# Patient Record
Sex: Female | Born: 1960
Health system: Southern US, Community
[De-identification: ages and names within clinical notes are randomized; demographics above are authoritative.]

## PROBLEM LIST (undated history)

## (undated) DIAGNOSIS — C4490 Unspecified malignant neoplasm of skin, unspecified: Secondary | ICD-10-CM

## (undated) DIAGNOSIS — L719 Rosacea, unspecified: Secondary | ICD-10-CM

## (undated) DIAGNOSIS — L821 Other seborrheic keratosis: Secondary | ICD-10-CM

## (undated) DIAGNOSIS — I2510 Atherosclerotic heart disease of native coronary artery without angina pectoris: Secondary | ICD-10-CM

## (undated) DIAGNOSIS — E785 Hyperlipidemia, unspecified: Secondary | ICD-10-CM

## (undated) HISTORY — PX: OTHER SURGICAL HISTORY: SHX169

## (undated) HISTORY — PX: BREAST REDUCTION SURGERY: SHX8

## (undated) HISTORY — DX: Hyperlipidemia, unspecified: E78.5

## (undated) HISTORY — DX: Atherosclerotic heart disease of native coronary artery without angina pectoris: I25.10

## (undated) HISTORY — PX: PLANTAR FASCIECTOMY: SUR600

---

## 1998-05-23 ENCOUNTER — Other Ambulatory Visit: Admission: RE | Admit: 1998-05-23 | Discharge: 1998-05-23 | Payer: Self-pay | Admitting: Obstetrics and Gynecology

## 1998-11-10 ENCOUNTER — Ambulatory Visit (HOSPITAL_BASED_OUTPATIENT_CLINIC_OR_DEPARTMENT_OTHER): Admission: RE | Admit: 1998-11-10 | Discharge: 1998-11-10 | Payer: Self-pay | Admitting: Orthopedic Surgery

## 1999-06-12 ENCOUNTER — Other Ambulatory Visit: Admission: RE | Admit: 1999-06-12 | Discharge: 1999-06-12 | Payer: Self-pay | Admitting: *Deleted

## 2000-06-09 ENCOUNTER — Other Ambulatory Visit: Admission: RE | Admit: 2000-06-09 | Discharge: 2000-06-09 | Payer: Self-pay | Admitting: *Deleted

## 2000-12-27 HISTORY — PX: BACK SURGERY: SHX140

## 2001-06-13 ENCOUNTER — Other Ambulatory Visit: Admission: RE | Admit: 2001-06-13 | Discharge: 2001-06-13 | Payer: Self-pay | Admitting: Obstetrics and Gynecology

## 2001-08-21 ENCOUNTER — Encounter: Payer: Self-pay | Admitting: Orthopedic Surgery

## 2001-08-22 ENCOUNTER — Encounter: Payer: Self-pay | Admitting: Orthopedic Surgery

## 2001-08-22 ENCOUNTER — Encounter (INDEPENDENT_AMBULATORY_CARE_PROVIDER_SITE_OTHER): Payer: Self-pay | Admitting: *Deleted

## 2001-08-22 ENCOUNTER — Ambulatory Visit (HOSPITAL_COMMUNITY): Admission: RE | Admit: 2001-08-22 | Discharge: 2001-08-23 | Payer: Self-pay | Admitting: Orthopedic Surgery

## 2002-06-26 ENCOUNTER — Other Ambulatory Visit: Admission: RE | Admit: 2002-06-26 | Discharge: 2002-06-26 | Payer: Self-pay | Admitting: Obstetrics and Gynecology

## 2003-07-09 ENCOUNTER — Other Ambulatory Visit: Admission: RE | Admit: 2003-07-09 | Discharge: 2003-07-09 | Payer: Self-pay | Admitting: Obstetrics and Gynecology

## 2006-01-21 ENCOUNTER — Encounter: Admission: RE | Admit: 2006-01-21 | Discharge: 2006-01-21 | Payer: Self-pay | Admitting: Family Medicine

## 2007-03-31 IMAGING — CT CT HEAD W/O CM
1 series · 16 of 30 positions shown, 20 images · IV contrast (agent unspecified)
Comparison: None.

CLINICAL DATA: Headaches.
 HEAD CT WITHOUT CONTRAST:
TECHNIQUE: Contiguous axial CT images were obtained from the base of the skull through the vertex according to standard protocol without contrast.

[Series 2: brain · axial · 0.49mm/px · z∈[+4,+143]mm · 16 of 30 slices shown, 20 images]
[im 2/30  brain]
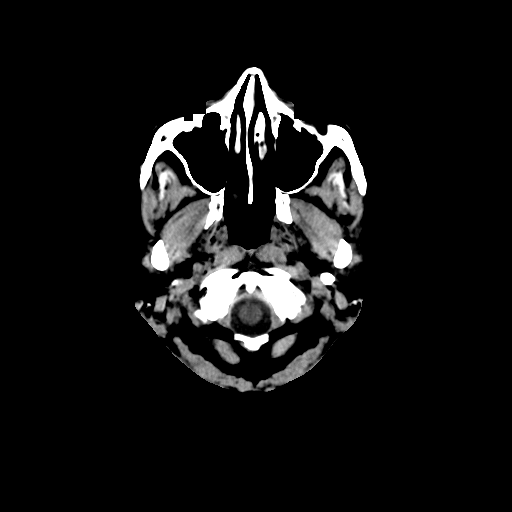
[im 2/30  bone]
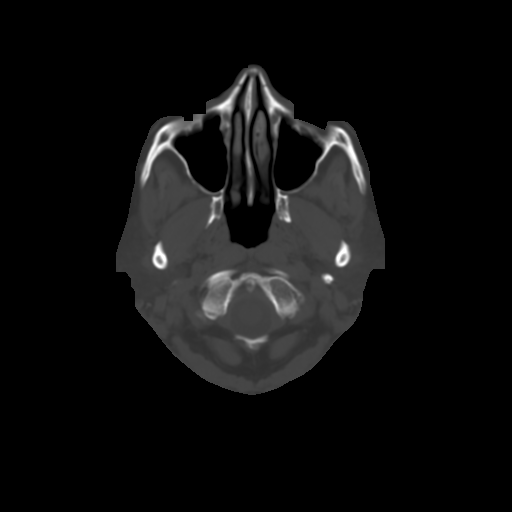
[im 4/30  brain]
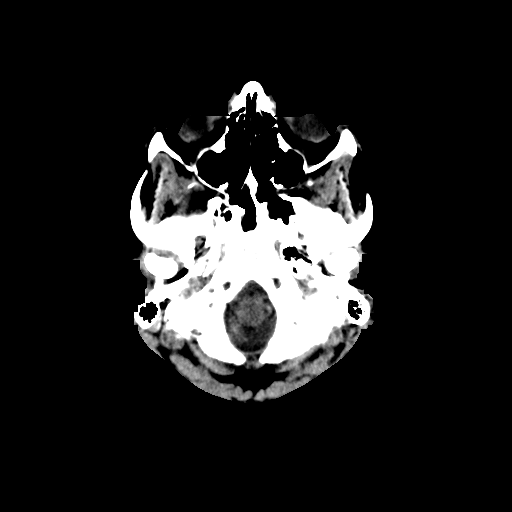
[im 6/30  brain]
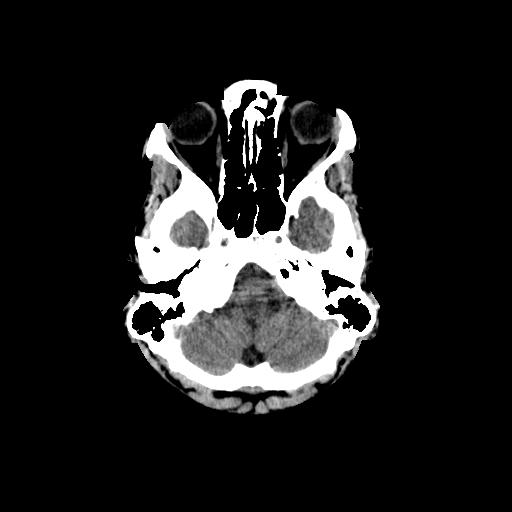
[im 8/30  brain]
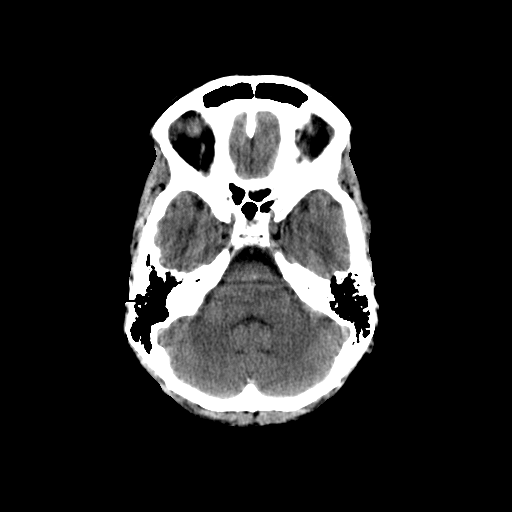
[im 9/30  brain]
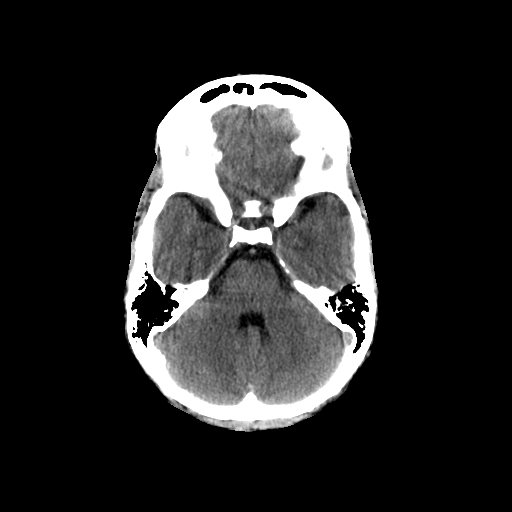
[im 9/30  bone]
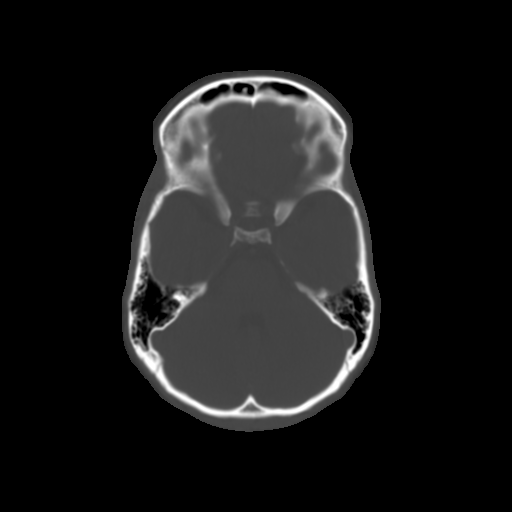
[im 11/30  brain]
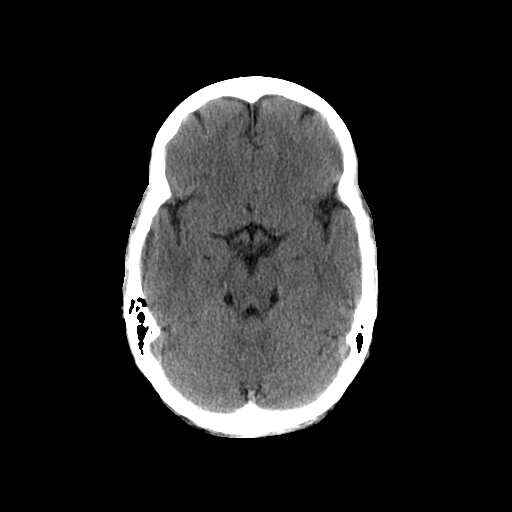
[im 13/30  brain]
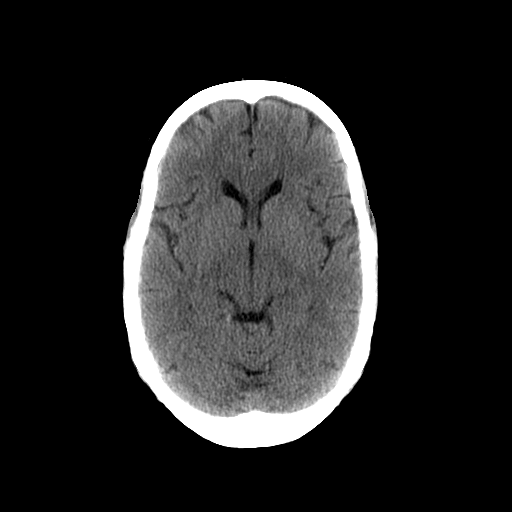
[im 15/30  brain]
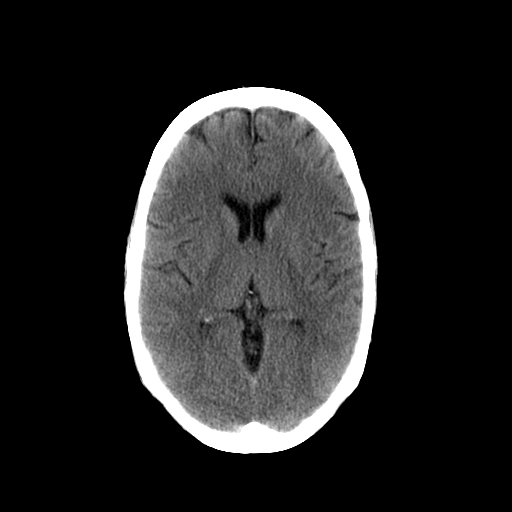
[im 16/30  brain]
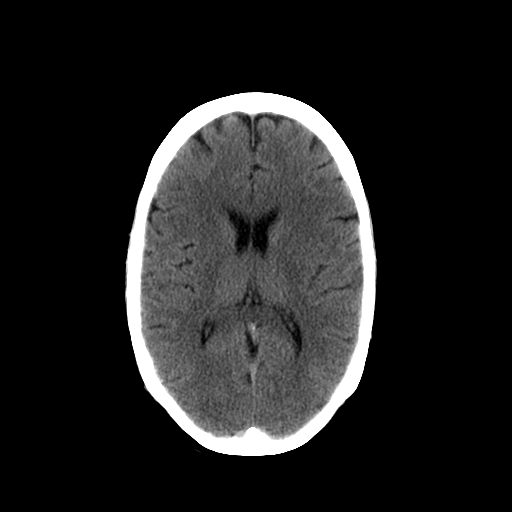
[im 16/30  bone]
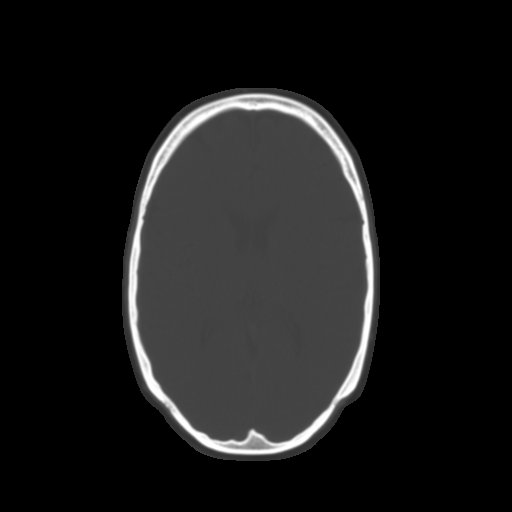
[im 18/30  brain]
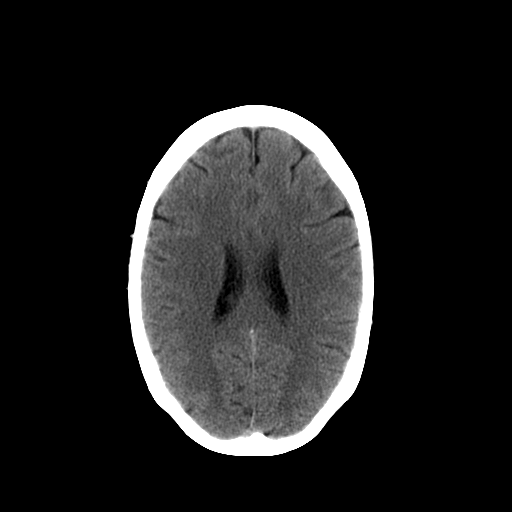
[im 20/30  brain]
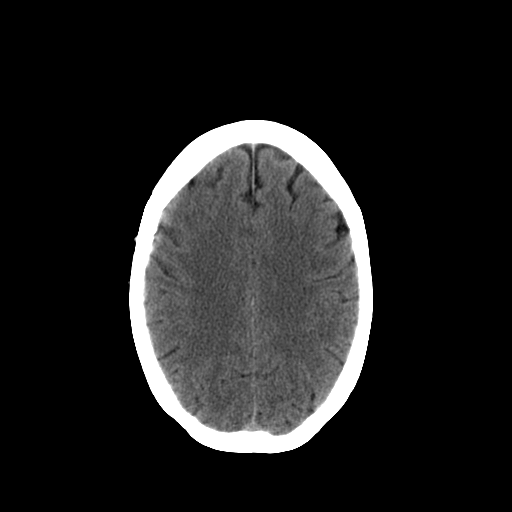
[im 22/30  brain]
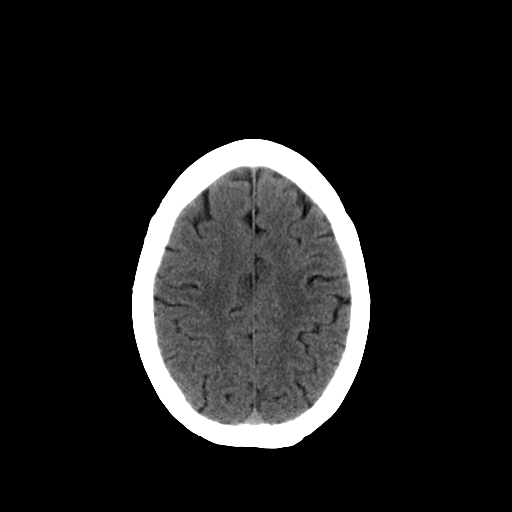
[im 23/30  brain]
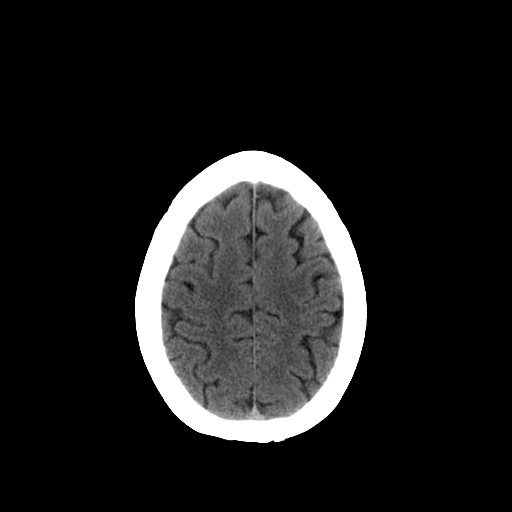
[im 23/30  bone]
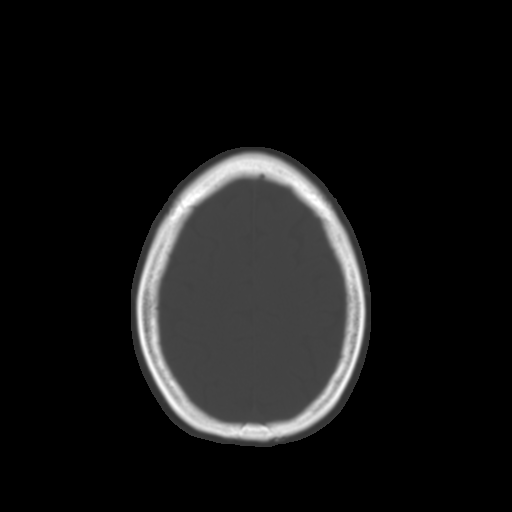
[im 25/30  brain]
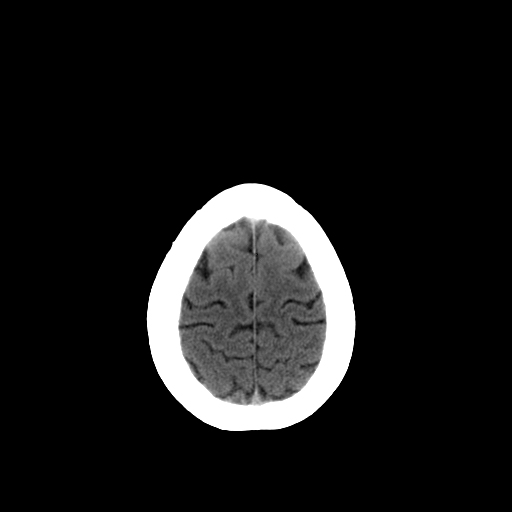
[im 27/30  brain]
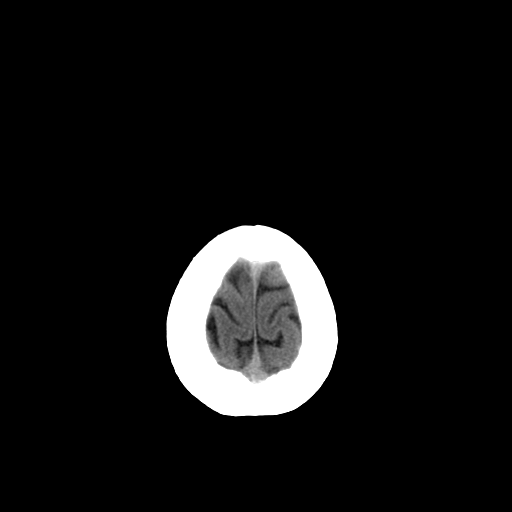
[im 29/30  brain]
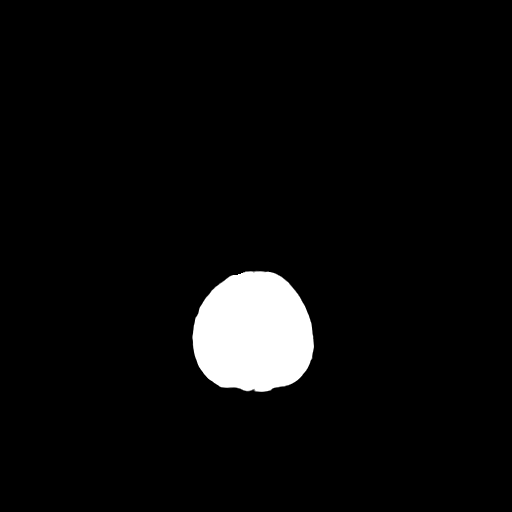

[16 of 30 positions shown; findings below may reference images not displayed]

FINDINGS: There is no evidence of intracranial hemorrhage, brain edema, or mass effect.  No other intra-axial abnormalities are seen, and the ventricles are within normal limits.  No abnormal extra-axial fluid collections or masses are identified.  No skull abnormalities are noted.
IMPRESSION: Negative non-contrast head CT.

## 2007-10-24 ENCOUNTER — Ambulatory Visit: Payer: Self-pay | Admitting: Gastroenterology

## 2007-10-24 LAB — CONVERTED CEMR LAB
ALT: 27 units/L (ref 0–35)
AST: 27 units/L (ref 0–37)
Albumin: 3.8 g/dL (ref 3.5–5.2)
Alkaline Phosphatase: 52 units/L (ref 39–117)
BUN: 17 mg/dL (ref 6–23)
Basophils Absolute: 0.1 10*3/uL (ref 0.0–0.1)
Basophils Relative: 0.6 % (ref 0.0–1.0)
Bilirubin, Direct: 0.1 mg/dL (ref 0.0–0.3)
CO2: 28 meq/L (ref 19–32)
Calcium: 9.1 mg/dL (ref 8.4–10.5)
Chloride: 101 meq/L (ref 96–112)
Creatinine, Ser: 1 mg/dL (ref 0.4–1.2)
Eosinophils Absolute: 0.2 10*3/uL (ref 0.0–0.6)
Eosinophils Relative: 1.9 % (ref 0.0–5.0)
GFR calc Af Amer: 76 mL/min
GFR calc non Af Amer: 63 mL/min
Glucose, Bld: 85 mg/dL (ref 70–99)
HCT: 42.7 % (ref 36.0–46.0)
Hemoglobin: 14.9 g/dL (ref 12.0–15.0)
Lymphocytes Relative: 36.2 % (ref 12.0–46.0)
MCHC: 34.9 g/dL (ref 30.0–36.0)
MCV: 91.2 fL (ref 78.0–100.0)
Monocytes Absolute: 1 10*3/uL — ABNORMAL HIGH (ref 0.2–0.7)
Monocytes Relative: 11.1 % — ABNORMAL HIGH (ref 3.0–11.0)
Neutro Abs: 4.2 10*3/uL (ref 1.4–7.7)
Neutrophils Relative %: 50.2 % (ref 43.0–77.0)
Platelets: 264 10*3/uL (ref 150–400)
Potassium: 4.1 meq/L (ref 3.5–5.1)
RBC: 4.68 M/uL (ref 3.87–5.11)
RDW: 12.1 % (ref 11.5–14.6)
Sed Rate: 7 mm/hr (ref 0–25)
Sodium: 135 meq/L (ref 135–145)
TSH: 1.73 microintl units/mL (ref 0.35–5.50)
Total Bilirubin: 0.6 mg/dL (ref 0.3–1.2)
Total Protein: 7 g/dL (ref 6.0–8.3)
WBC: 8.6 10*3/uL (ref 4.5–10.5)

## 2007-11-03 ENCOUNTER — Encounter: Payer: Self-pay | Admitting: Gastroenterology

## 2007-11-03 ENCOUNTER — Ambulatory Visit: Payer: Self-pay | Admitting: Gastroenterology

## 2008-11-08 ENCOUNTER — Other Ambulatory Visit: Admission: RE | Admit: 2008-11-08 | Discharge: 2008-11-08 | Payer: Self-pay | Admitting: Family Medicine

## 2010-10-02 ENCOUNTER — Other Ambulatory Visit: Admission: RE | Admit: 2010-10-02 | Discharge: 2010-10-02 | Payer: Self-pay | Admitting: Family Medicine

## 2011-05-11 NOTE — Assessment & Plan Note (Signed)
HEALTHCARE                         GASTROENTEROLOGY OFFICE NOTE   NAME:Heather Pace, Heather Pace                      MRN:          [id number]  DATE:10/24/2007                            DOB:          1961/07/08    REFERRING PHYSICIAN:  Cordelia Pen A. Rosalio Macadamia, M.D.   PRIMARY CARE PHYSICIAN:  Dr. Blair Heys.   REASON FOR REFERRAL:  Dr. Rosalio Macadamia asked me to evaluate Heather Pace in  consultation regarding abnormal rectal tissue palpated on routine  examination.   HISTORY OF PRESENT ILLNESS:  Heather Pace is a very pleasant 50 year old  woman who had her yearly gynecologic examination recently and her  gynecologist noted some irregular rough nodular tissue thought possibly  consistent with condyloma in her anal examination.  She was sent here  for further evaluation.  On questioning, she does have mild intermittent  rectal bleeding going back many years, usually just a little bit on the  tissue paper, sometimes in the stool.  She seems to have somewhat  scybalous stools, but never dramatic constipation.  For the past month  or so, she has had some urgency associated with her bowel movements.  She has never had fecal incontinence.  She does not have frank diarrhea.   REVIEW OF SYSTEMS:  Review of systems is notable for a 24-pound weight  gain in the past year, otherwise essentially normal and is available on  nursing intake sheet.   PAST MEDICAL HISTORY:  1. Elevated cholesterol.  2. Migraine headaches.  3. Depression.  4. Breast reduction in 1997.   CURRENT MEDICATIONS:  Vytorin and Seasonale.   ALLERGIES:  No known drug allergies.   SOCIAL HISTORY:  Single, lives by herself, works in shipping and  receiving, nonsmoker, drinks very little alcohol.   FAMILY HISTORY:  No colon cancer or colon polyps in family.   PHYSICAL EXAMINATION:  Height 5 feet 10 inches, 209 pounds.  Blood  pressure 142/80, pulse 88.  CONSTITUTIONAL:  Generally well  appearing.  NEUROLOGIC:  Alert and oriented x3.  EYES:  Extraocular movements intact.  MOUTH:  Oropharynx moist, no lesions.  NECK:  Supple.  No lymphadenopathy.  CARDIOVASCULAR:  Heart regular rate and rhythm.  LUNGS:  Clear to auscultation bilaterally.  ABDOMEN:  Soft, nontender and non-distended.  Normal bowel sounds.  EXTREMITIES:  No lower extremity edema.  SKIN:  No rashes or lesions on visible extremities.  ANORECTAL:  Examination performed with CMA in room.  There was a small  amount of obvious external hemorrhoids tissue.  I did not feel any rough  patches internally or externally, although it does feel like she has  some small internal hemorrhoids.  No masses palpated.   ASSESSMENT AND PLAN:  [age]-year-old woman with intermittent  bright red blood per rectum, abnormal anorectal examination by  gynecologist recently.   I do not note anything abnormal on her anorectal examination today,  except for some small external hemorrhoids and probably small internal  hemorrhoids.  This is probably what was palpated by her gynecologist  recently.  She does have mild intermittent rectal bleeding and I think  for that  we should proceed with full colonoscopy at her soonest  convenience to make sure there are no other concerning lesions.  She  will be due for a full screening colonoscopy in a year or 2 anyway.  She  will get a basic set of laboratories today including a complete  metabolic profile and a CBC and thyroid testing.     Rachael Fee, MD  Electronically Signed    DPJ/MedQ  DD: 10/24/2007  DT: 10/25/2007  Job #: [id number]   cc:   Sherry A. Rosalio Macadamia, M.D.  Bryan Lemma. Manus Gunning, M.D.

## 2011-05-14 NOTE — Op Note (Signed)
[place]  Patient:    Heather, Pace Visit Number: [id number] MRN: [id number]          Service Type: DSU Location: 5000 [id number] Attending Physician:  Drema Pry Dictated by:   Jearld Adjutant, M.D. Proc. Date: 08/22/01 Adm. Date:  08/22/2001   CC:         Jamesetta Geralds, M.D.  Eula Listen, M.D.   Operative Report  PREOPERATIVE DIAGNOSIS:  Large herniated nucleus pulposus right L5-S1.  POSTOPERATIVE DIAGNOSIS:  L herniated nucleus pulposus right L5-S1.  PROCEDURE:  Hemilaminotomy right L5-S1 with disk excision and S1 nerve root decompression.  ASSISTANT:  Darien Ramus, P.A.-C.  ANESTHESIA:  General endotracheal.  CULTURES:  None.  DRAINS:  None.  ESTIMATED BLOOD LOSS:  Less than 100 cc.  REPLACEMENT:  Without.  PATHOLOGIC FINDINGS/HISTORY:  Heather Pace is a 50 year old female, who came to our office, saw Dr. Eula Listen with low-back and leg pain.  She had been given a prednisone dose pack.  Ultimately, the pain persisted.  She was planning on going to a trip to [place] at the end of the month.  She came back, a MRI was done showing a huge herniated disk with what looked to be almost a free fragment projecting in L5-S1 and caudad.  This was on the right side. She had significant radiculopathy, S1 distribution pain, positive straight leg raising, cross-straight leg raising, diminished ankle jerk and elected to postpone her [place] trip and proceed with surgery.  At surgery, the nerve root was bound down, reddened and irritated over this huge disk, which had a tuft broken through the annulus.  A large lump of disk was removed with the first incision into the disk space measuring 1 x 3 x 1.5 cm.  There were additional multiple disk fragments, but the nerve root was free at closure.  We did a foraminotomy.  DESCRIPTION OF PROCEDURE:  With adequate anesthesia obtained using endotracheal technique, 1 g Ancef given IV  prophylaxis.  The patient was placed prone on chest rolls in a flexed position.  We then used Betadine prep and a spinal needle, took a scout film, which pointed [place] just inferior to L5-S1.  After standard prepping and draping, a slightly more superior incision was made approximately two inches in length.  The incision was deepened sharply with knife and hemostasis obtained using the Bovie electrocoagulator. Dissection was carried down to the lumbar fascia on the right side, as well as the lumbar musculature, which was elevated laterally.  This exposed the L5-S1 interspace the last mobile segment.  We then used a Kerrison punch superiorly on the lamina of five and then placed into this space, a #4 Penfield, got an additional film pointing [place] to L5-S1.  Hemilaminotomy was then completed.  We then removed the ligamentum flavum from medial/lateral at the joint space. This exposed the dural sac and nerve root.  We had to widen the laminectomy laterally and then carefully teased the nerve root medially over the massive disk.  The nerve root was tented over top.  I then made an incision into the annulus, removed the large disk fragment and then meticulously scraped the disk space with pituitaries, ring curets and Epstein removing disk up underneath the posterior longitudinal ligament laterally and all throughout the disk space.  Thorough irrigation of the disk was then carried out to loosen any other fragments and Betadine and peroxide were placed deep, then with regular irrigation in the disk  space.  I then placed a hockey-stick underneath the nerve root and passed it out the foramen and made sure there was no additional fragments.  I did increase the foraminotomy with Kerrison at closure.  Gelfoam was then gently packed around the nerve root.  I placed 100 mcg of fentanyl on the nerve root for two minutes, then sucked that off. I then placed 1 cc 80 mg/mL Depo Medrol on the nerve root, packed the  Gelfoam around.  I then placed 0.75% Marcaine plain in and about the lumbar musculature 30 cc.  Additional irrigation was carried out.  The retractors were removed, the self-retaining retractors that had been used and bleeding points cauterized.  The wound was then closed in layers with #1 Vicryl on the lumbar fascia, 0 and 2-0 Vicryl in the subcu and 3-0 Monocryl with Steri-Strips.  A bulky, sterile compressive dressing was applied and the patient, having tolerated the procedure well, was awakened, taken to the recovery room in satisfactory condition to be admitted for overnight observation and analgesia. Dictated by:   Jearld Adjutant, M.D. Attending Physician:  Drema Pry DD:  08/22/01 TD:  08/22/01 Job: [id number]

## 2014-05-21 ENCOUNTER — Other Ambulatory Visit: Payer: Self-pay | Admitting: Dermatology

## 2017-11-14 ENCOUNTER — Encounter: Payer: Self-pay | Admitting: Gastroenterology

## 2017-12-27 HISTORY — PX: COLONOSCOPY: SHX174

## 2018-01-06 ENCOUNTER — Other Ambulatory Visit: Payer: Self-pay

## 2018-01-06 ENCOUNTER — Ambulatory Visit (AMBULATORY_SURGERY_CENTER): Payer: Self-pay | Admitting: *Deleted

## 2018-01-06 VITALS — Ht 70.0 in | Wt 219.8 lb

## 2018-01-06 DIAGNOSIS — Z1211 Encounter for screening for malignant neoplasm of colon: Secondary | ICD-10-CM

## 2018-01-06 MED ORDER — PEG 3350-KCL-NA BICARB-NACL 420 G PO SOLR
4000.0000 mL | Freq: Once | ORAL | 0 refills | Status: AC
Start: 2018-01-06 — End: 2018-01-06

## 2018-01-06 MED FILL — PEG-3350 AND ELECTROLYTES S: 236 | 1 days supply | Qty: 4000 | Fill #0

## 2018-01-06 NOTE — Progress Notes (Signed)
No egg or soy allergy known to patient  No issues with past sedation with any surgeries  or procedures, no intubation problems  No diet pills per patient No home 02 use per patient  No blood thinners per patient  Pt denies issues with constipation  No A fib or A flutter  EMMI video sent to pt's e mail  

## 2018-01-11 ENCOUNTER — Encounter: Payer: Self-pay | Admitting: Gastroenterology

## 2018-01-18 HISTORY — PX: LESION REMOVAL: SHX5196

## 2018-01-20 ENCOUNTER — Encounter: Payer: Self-pay | Admitting: Gastroenterology

## 2018-01-20 ENCOUNTER — Other Ambulatory Visit: Payer: Self-pay

## 2018-01-20 ENCOUNTER — Ambulatory Visit (AMBULATORY_SURGERY_CENTER): Payer: BLUE CROSS/BLUE SHIELD | Admitting: Gastroenterology

## 2018-01-20 VITALS — BP 139/84 | HR 64 | Temp 99.1°F | Resp 13 | Ht 70.0 in | Wt 219.0 lb

## 2018-01-20 DIAGNOSIS — Z1212 Encounter for screening for malignant neoplasm of rectum: Secondary | ICD-10-CM | POA: Diagnosis not present

## 2018-01-20 DIAGNOSIS — Z1211 Encounter for screening for malignant neoplasm of colon: Secondary | ICD-10-CM | POA: Diagnosis present

## 2018-01-20 DIAGNOSIS — K5730 Diverticulosis of large intestine without perforation or abscess without bleeding: Secondary | ICD-10-CM

## 2018-01-20 MED ORDER — SODIUM CHLORIDE 0.9 % IV SOLN: 500.0000 mL | INTRAVENOUS | Status: DC

## 2018-01-20 NOTE — Patient Instructions (Signed)
YOU HAD AN ENDOSCOPIC PROCEDURE TODAY AT THE [facility]:   Refer to the procedure report that was given to you for any specific questions about what was found during the examination.  If the procedure report does not answer your questions, please call your gastroenterologist to clarify.  If you requested that your care partner not be given the details of your procedure findings, then the procedure report has been included in a sealed envelope for you to review at your convenience later.  YOU SHOULD EXPECT: Some feelings of bloating in the abdomen. Passage of more gas than usual.  Walking can help get rid of the air that was put into your GI tract during the procedure and reduce the bloating. If you had a lower endoscopy (such as a colonoscopy or flexible sigmoidoscopy) you may notice spotting of blood in your stool or on the toilet paper. If you underwent a bowel prep for your procedure, you may not have a normal bowel movement for a few days.  Please Note:  You might notice some irritation and congestion in your nose or some drainage.  This is from the oxygen used during your procedure.  There is no need for concern and it should clear up in a day or so.  SYMPTOMS TO REPORT IMMEDIATELY:   Following lower endoscopy (colonoscopy or flexible sigmoidoscopy):  Excessive amounts of blood in the stool  Significant tenderness or worsening of abdominal pains  Swelling of the abdomen that is new, acute  Fever of 100F or higher  Please see handout on diverticulosis.  For urgent or emergent issues, a gastroenterologist can be reached at any hour by calling [phone].   DIET:  We do recommend a small meal at first, but then you may proceed to your regular diet.  Drink plenty of fluids but you should avoid alcoholic beverages for 24 hours.  ACTIVITY:  You should plan to take it easy for the rest of today and you should NOT DRIVE or use heavy machinery until tomorrow (because of the  sedation medicines used during the test).    FOLLOW UP: Our staff will call the number listed on your records the next business day following your procedure to check on you and address any questions or concerns that you may have regarding the information given to you following your procedure. If we do not reach you, we will leave a message.  However, if you are feeling well and you are not experiencing any problems, there is no need to return our call.  We will assume that you have returned to your regular daily activities without incident.  If any biopsies were taken you will be contacted by phone or by letter within the next 1-3 weeks.  Please call us at [phone] if you have not heard about the biopsies in 3 weeks.    SIGNATURES/CONFIDENTIALITY: You and/or your care partner have signed paperwork which will be entered into your electronic medical record.  These signatures attest to the fact that that the information above on your After Visit Summary has been reviewed and is understood.  Full responsibility of the confidentiality of this discharge information lies with you and/or your care-partner.   Thank you for allowing us to provide your healthcare today.  

## 2018-01-20 NOTE — Op Note (Signed)
[facility] Patient Name: Heather Pace Procedure Date: 01/20/2018 11:24 AM MRN: [id number] Endoscopist: Milus Banister , MD Age: 57 Referring MD:  Date of Birth: 11/23/1961 Gender: Female Account #: [id number] Procedure:                Colonoscopy Indications:              Screening for colorectal malignant neoplasm Medicines:                Monitored Anesthesia Care Procedure:                Pre-Anesthesia Assessment:                           - Prior to the procedure, a History and Physical                            was performed, and patient medications and                            allergies were reviewed. The patient's tolerance of                            previous anesthesia was also reviewed. The risks                            and benefits of the procedure and the sedation                            options and risks were discussed with the patient.                            All questions were answered, and informed consent                            was obtained. Prior Anticoagulants: The patient has                            taken no previous anticoagulant or antiplatelet                            agents. ASA Grade Assessment: II - A patient with                            mild systemic disease. After reviewing the risks                            and benefits, the patient was deemed in                            satisfactory condition to undergo the procedure.                           After obtaining informed consent, the colonoscope  was passed under direct vision. Throughout the                            procedure, the patient's blood pressure, pulse, and                            oxygen saturations were monitored continuously. The                            Model CF-HQ190L [id number]) scope was introduced                            through the anus and advanced to the the cecum,                            identified by  appendiceal orifice and ileocecal                            valve. The colonoscopy was performed without                            difficulty. The patient tolerated the procedure                            well. The quality of the bowel preparation was                            good. The ileocecal valve, appendiceal orifice, and                            rectum were photographed. Findings:                 Multiple small and large-mouthed diverticula were                            found in the entire colon.                           The exam was otherwise without abnormality on                            direct and retroflexion views. Complications:            No immediate complications. Estimated blood loss:                            None. Estimated Blood Loss:     Estimated blood loss: none. Impression:               - Diverticulosis in the entire examined colon.                           - The examination was otherwise normal on direct  and retroflexion views.                           - No polyps or cancers. Recommendation:           - Patient has a contact number available for                            emergencies. The signs and symptoms of potential                            delayed complications were discussed with the                            patient. Return to normal activities tomorrow.                            Written discharge instructions were provided to the                            patient.                           - Resume previous diet.                           - Continue present medications.                           - Repeat colonoscopy in 10 years for screening. Milus Banister, MD 01/20/2018 11:46:26 AM This report has been signed electronically.

## 2018-01-20 NOTE — Progress Notes (Signed)
Pt had mild spasm at end of case releaved with just suctioning and jaw thrust. Pt sats recovered quickly and is now doing well on room air. To PACU, VSS. Report to RN.tb

## 2018-01-23 ENCOUNTER — Telehealth: Payer: Self-pay

## 2018-01-23 NOTE — Telephone Encounter (Signed)
  Follow up Call-  Call back number 01/20/2018  Post procedure Call Back phone  # [phone]  Permission to leave phone message Yes  Some recent data might be hidden     Patient questions:  Do you have a fever, pain , or abdominal swelling? No. Pain Score  0 *  Have you tolerated food without any problems? Yes.    Have you been able to return to your normal activities? Yes.    Do you have any questions about your discharge instructions: Diet   No. Medications  No. Follow up visit  No.  Do you have questions or concerns about your Care? No.  Actions: * If pain score is 4 or above: No action needed, pain <4.

## 2020-04-17 ENCOUNTER — Ambulatory Visit: Payer: BLUE CROSS/BLUE SHIELD | Attending: Internal Medicine

## 2020-04-17 DIAGNOSIS — Z23 Encounter for immunization: Secondary | ICD-10-CM

## 2020-04-17 NOTE — Progress Notes (Signed)
   Covid-19 Vaccination Clinic  Name:  Heather Pace    MRN: [id number] DOB: 1961-02-27  04/17/2020  [name] was observed post Covid-19 immunization for 15 minutes without incident. She was provided with Vaccine Information Sheet and instruction to access the V-Safe system.   [name] was instructed to call 911 with any severe reactions post vaccine: Marland Kitchen Difficulty breathing  . Swelling of face and throat  . A fast heartbeat  . A bad rash all over body  . Dizziness and weakness   Immunizations Administered    Name Date Dose VIS Date Route   Pfizer COVID-19 Vaccine 04/17/2020 11:32 AM 0.3 mL 02/20/2019 Intramuscular   Manufacturer: [place]   Lot: [id number]   [place]: [id number]

## 2020-05-12 ENCOUNTER — Ambulatory Visit: Payer: BLUE CROSS/BLUE SHIELD | Attending: Internal Medicine

## 2020-05-12 DIAGNOSIS — Z23 Encounter for immunization: Secondary | ICD-10-CM

## 2020-05-12 NOTE — Progress Notes (Signed)
   Covid-19 Vaccination Clinic  Name:  EISELE DOWNHAM    MRN: [id number] DOB: 06-13-1961  05/12/2020  Ms. Sturges was observed post Covid-19 immunization for 15 minutes without incident. She was provided with Vaccine Information Sheet and instruction to access the V-Safe system.   Ms. Halpern was instructed to call 911 with any severe reactions post vaccine: Marland Kitchen Difficulty breathing  . Swelling of face and throat  . A fast heartbeat  . A bad rash all over body  . Dizziness and weakness   Immunizations Administered    Name Date Dose VIS Date Route   Pfizer COVID-19 Vaccine 05/12/2020  9:53 AM 0.3 mL 02/20/2019 Intramuscular   Manufacturer: [place]   Lot: [id number]   [place]: [id number]

## 2021-04-26 DIAGNOSIS — Z95820 Peripheral vascular angioplasty status with implants and grafts: Secondary | ICD-10-CM

## 2021-04-26 DIAGNOSIS — I214 Non-ST elevation (NSTEMI) myocardial infarction: Secondary | ICD-10-CM

## 2021-04-26 HISTORY — DX: Non-ST elevation (NSTEMI) myocardial infarction: I21.4

## 2021-04-26 HISTORY — DX: Peripheral vascular angioplasty status with implants and grafts: Z95.820

## 2021-05-05 ENCOUNTER — Emergency Department (HOSPITAL_COMMUNITY): Payer: BLUE CROSS/BLUE SHIELD

## 2021-05-05 ENCOUNTER — Other Ambulatory Visit: Payer: Self-pay

## 2021-05-05 ENCOUNTER — Inpatient Hospital Stay (HOSPITAL_COMMUNITY)
Admission: EM | Admit: 2021-05-05 | Discharge: 2021-05-07 | DRG: 247 | Disposition: A | Discharge status: Home / Self Care | Payer: BLUE CROSS/BLUE SHIELD | Attending: Cardiovascular Disease | Admitting: Cardiovascular Disease

## 2021-05-05 ENCOUNTER — Encounter (HOSPITAL_COMMUNITY): Payer: Self-pay

## 2021-05-05 DIAGNOSIS — Z85828 Personal history of other malignant neoplasm of skin: Secondary | ICD-10-CM

## 2021-05-05 DIAGNOSIS — E7849 Other hyperlipidemia: Secondary | ICD-10-CM | POA: Diagnosis present

## 2021-05-05 DIAGNOSIS — R079 Chest pain, unspecified: Secondary | ICD-10-CM | POA: Diagnosis not present

## 2021-05-05 DIAGNOSIS — E669 Obesity, unspecified: Secondary | ICD-10-CM | POA: Diagnosis present

## 2021-05-05 DIAGNOSIS — I10 Essential (primary) hypertension: Secondary | ICD-10-CM | POA: Diagnosis present

## 2021-05-05 DIAGNOSIS — E785 Hyperlipidemia, unspecified: Secondary | ICD-10-CM

## 2021-05-05 DIAGNOSIS — I25110 Atherosclerotic heart disease of native coronary artery with unstable angina pectoris: Secondary | ICD-10-CM | POA: Diagnosis not present

## 2021-05-05 DIAGNOSIS — I214 Non-ST elevation (NSTEMI) myocardial infarction: Principal | ICD-10-CM | POA: Diagnosis present

## 2021-05-05 DIAGNOSIS — Z6833 Body mass index (BMI) 33.0-33.9, adult: Secondary | ICD-10-CM | POA: Diagnosis not present

## 2021-05-05 DIAGNOSIS — Z79899 Other long term (current) drug therapy: Secondary | ICD-10-CM

## 2021-05-05 DIAGNOSIS — E782 Mixed hyperlipidemia: Secondary | ICD-10-CM | POA: Diagnosis not present

## 2021-05-05 DIAGNOSIS — Z28310 Unvaccinated for covid-19: Secondary | ICD-10-CM | POA: Diagnosis not present

## 2021-05-05 DIAGNOSIS — I200 Unstable angina: Secondary | ICD-10-CM | POA: Diagnosis present

## 2021-05-05 DIAGNOSIS — Z20822 Contact with and (suspected) exposure to covid-19: Secondary | ICD-10-CM | POA: Diagnosis present

## 2021-05-05 DIAGNOSIS — I249 Acute ischemic heart disease, unspecified: Secondary | ICD-10-CM

## 2021-05-05 DIAGNOSIS — Z8249 Family history of ischemic heart disease and other diseases of the circulatory system: Secondary | ICD-10-CM

## 2021-05-05 DIAGNOSIS — I25750 Atherosclerosis of native coronary artery of transplanted heart with unstable angina: Secondary | ICD-10-CM

## 2021-05-05 DIAGNOSIS — I2510 Atherosclerotic heart disease of native coronary artery without angina pectoris: Secondary | ICD-10-CM | POA: Diagnosis present

## 2021-05-05 DIAGNOSIS — Z955 Presence of coronary angioplasty implant and graft: Secondary | ICD-10-CM

## 2021-05-05 HISTORY — DX: Other seborrheic keratosis: L82.1

## 2021-05-05 HISTORY — DX: Rosacea, unspecified: L71.9

## 2021-05-05 HISTORY — DX: Unstable angina: I20.0

## 2021-05-05 HISTORY — DX: Unspecified malignant neoplasm of skin, unspecified: C44.90

## 2021-05-05 LAB — CBC
HCT: 50.9 % — ABNORMAL HIGH (ref 36.0–46.0)
Hemoglobin: 16.7 g/dL — ABNORMAL HIGH (ref 12.0–15.0)
MCH: 30.4 pg (ref 26.0–34.0)
MCHC: 32.8 g/dL (ref 30.0–36.0)
MCV: 92.7 fL (ref 80.0–100.0)
Platelets: 273 10*3/uL (ref 150–400)
RBC: 5.49 MIL/uL — ABNORMAL HIGH (ref 3.87–5.11)
RDW: 13.2 % (ref 11.5–15.5)
WBC: 7.5 10*3/uL (ref 4.0–10.5)
nRBC: 0 % (ref 0.0–0.2)

## 2021-05-05 LAB — TROPONIN I (HIGH SENSITIVITY)
Troponin I (High Sensitivity): 130 ng/L (ref <18)
Troponin I (High Sensitivity): 150 ng/L (ref <18)
Troponin I (High Sensitivity): 173 ng/L (ref <18)
Troponin I (High Sensitivity): 182 ng/L (ref <18)

## 2021-05-05 LAB — HEMOGLOBIN A1C
Hgb A1c MFr Bld: 6.3 % — ABNORMAL HIGH (ref 4.8–5.6)
Mean Plasma Glucose: 134.11 mg/dL

## 2021-05-05 LAB — BASIC METABOLIC PANEL
Anion gap: 8 (ref 5–15)
BUN: 11 mg/dL (ref 8–23)
CO2: 24 mmol/L (ref 22–32)
Calcium: 9.4 mg/dL (ref 8.9–10.3)
Chloride: 104 mmol/L (ref 98–111)
Creatinine, Ser: 0.99 mg/dL (ref 0.44–1.00)
GFR, Estimated: >60 mL/min (ref >60)
Glucose, Bld: 113 mg/dL — ABNORMAL HIGH (ref 70–99)
Potassium: 3.8 mmol/L (ref 3.5–5.1)
Sodium: 136 mmol/L (ref 135–145)

## 2021-05-05 LAB — RESP PANEL BY RT-PCR (FLU A&B, COVID) ARPGX2
Influenza A by PCR: NEGATIVE
Influenza B by PCR: NEGATIVE
SARS Coronavirus 2 by RT PCR: NEGATIVE

## 2021-05-05 LAB — HEPATIC FUNCTION PANEL
ALT: 38 U/L (ref 0–44)
AST: 31 U/L (ref 15–41)
Albumin: 4.3 g/dL (ref 3.5–5.0)
Alkaline Phosphatase: 81 U/L (ref 38–126)
Bilirubin, Direct: 0.2 mg/dL (ref 0.0–0.2)
Indirect Bilirubin: 0.8 mg/dL (ref 0.3–0.9)
Total Bilirubin: 1 mg/dL (ref 0.3–1.2)
Total Protein: 7.4 g/dL (ref 6.5–8.1)

## 2021-05-05 LAB — HIV ANTIBODY (ROUTINE TESTING W REFLEX): HIV Screen 4th Generation wRfx: NONREACTIVE

## 2021-05-05 LAB — HEPARIN LEVEL (UNFRACTIONATED): Heparin Unfractionated: 0.31 IU/mL (ref 0.30–0.70)

## 2021-05-05 LAB — TSH: TSH: 1.932 u[IU]/mL (ref 0.350–4.500)

## 2021-05-05 MED ORDER — ASPIRIN 81 MG PO CHEW
81.0000 mg | CHEWABLE_TABLET | ORAL | Status: AC
  Administered 2021-05-06: 81 mg via ORAL
  Filled 2021-05-05: qty 1

## 2021-05-05 MED ORDER — SODIUM CHLORIDE 0.9 % IV SOLN: 250.0000 mL | INTRAVENOUS | Status: DC | PRN

## 2021-05-05 MED ORDER — ASPIRIN 81 MG PO CHEW
324.0000 mg | CHEWABLE_TABLET | Freq: Once | ORAL | Status: AC
  Administered 2021-05-05: 324 mg via ORAL
  Filled 2021-05-05: qty 4

## 2021-05-05 MED ORDER — SODIUM CHLORIDE 0.9 % WEIGHT BASED INFUSION
3.0000 mL/kg/h | INTRAVENOUS | Status: DC
  Administered 2021-05-06: 3 mL/kg/h via INTRAVENOUS

## 2021-05-05 MED ORDER — SODIUM CHLORIDE 0.9 % WEIGHT BASED INFUSION
1.0000 mL/kg/h | INTRAVENOUS | Status: DC
  Administered 2021-05-06: 1 mL/kg/h via INTRAVENOUS

## 2021-05-05 MED ORDER — ALPRAZOLAM 0.25 MG PO TABS
0.2500 mg | ORAL_TABLET | Freq: Two times a day (BID) | ORAL | Status: DC | PRN
Start: 2021-05-05 — End: 2021-05-07
  Administered 2021-05-06: 0.25 mg via ORAL
  Filled 2021-05-05: qty 1

## 2021-05-05 MED ORDER — METOPROLOL TARTRATE 25 MG PO TABS
25.0000 mg | ORAL_TABLET | Freq: Two times a day (BID) | ORAL | Status: DC
  Administered 2021-05-05 – 2021-05-07 (×4): 25 mg via ORAL
  Filled 2021-05-05 (×5): qty 1

## 2021-05-05 MED ORDER — HEPARIN BOLUS VIA INFUSION
4000.0000 [IU] | Freq: Once | INTRAVENOUS | Status: AC
  Administered 2021-05-05: 4000 [IU] via INTRAVENOUS
  Filled 2021-05-05: qty 4000

## 2021-05-05 MED ORDER — ZOLPIDEM TARTRATE 5 MG PO TABS
5.0000 mg | ORAL_TABLET | Freq: Every evening | ORAL | Status: DC | PRN
  Administered 2021-05-05: 5 mg via ORAL
  Filled 2021-05-05: qty 1

## 2021-05-05 MED ORDER — SODIUM CHLORIDE 0.9% FLUSH
3.0000 mL | Freq: Two times a day (BID) | INTRAVENOUS | Status: DC
Start: 2021-05-05 — End: 2021-05-06
  Administered 2021-05-05: 3 mL via INTRAVENOUS

## 2021-05-05 MED ORDER — FENTANYL CITRATE (PF) 100 MCG/2ML IJ SOLN
50.0000 ug | Freq: Once | INTRAMUSCULAR | Status: AC
  Administered 2021-05-05: 50 ug via INTRAVENOUS
  Filled 2021-05-05: qty 2

## 2021-05-05 MED ORDER — ATORVASTATIN CALCIUM 80 MG PO TABS
80.0000 mg | ORAL_TABLET | Freq: Every day | ORAL | Status: DC
  Administered 2021-05-05 – 2021-05-07 (×3): 80 mg via ORAL
  Filled 2021-05-05: qty 2
  Filled 2021-05-05 (×2): qty 1

## 2021-05-05 MED ORDER — HEPARIN (PORCINE) 25000 UT/250ML-% IV SOLN
1150.0000 [IU]/h | INTRAVENOUS | Status: DC
  Administered 2021-05-05: 1000 [IU]/h via INTRAVENOUS
  Filled 2021-05-05: qty 250

## 2021-05-05 MED ORDER — NITROGLYCERIN 0.4 MG SL SUBL
0.4000 mg | SUBLINGUAL_TABLET | Freq: Once | SUBLINGUAL | Status: AC
  Administered 2021-05-05: 0.4 mg via SUBLINGUAL
  Filled 2021-05-05: qty 1

## 2021-05-05 MED ORDER — ACETAMINOPHEN 325 MG PO TABS
650.0000 mg | ORAL_TABLET | ORAL | Status: DC | PRN
  Administered 2021-05-05: 650 mg via ORAL
  Filled 2021-05-05: qty 2

## 2021-05-05 MED ORDER — ONDANSETRON HCL 4 MG/2ML IJ SOLN
4.0000 mg | Freq: Four times a day (QID) | INTRAMUSCULAR | Status: DC | PRN
  Filled 2021-05-05: qty 2

## 2021-05-05 MED ORDER — SODIUM CHLORIDE 0.9% FLUSH: 3.0000 mL | INTRAVENOUS | Status: DC | PRN

## 2021-05-05 MED ORDER — NITROGLYCERIN IN D5W 200-5 MCG/ML-% IV SOLN
0.0000 ug/min | INTRAVENOUS | Status: DC
  Administered 2021-05-05: 5 ug/min via INTRAVENOUS
  Filled 2021-05-05: qty 250

## 2021-05-05 MED ORDER — SODIUM CHLORIDE 0.9% FLUSH
3.0000 mL | Freq: Two times a day (BID) | INTRAVENOUS | Status: DC
  Administered 2021-05-05: 3 mL via INTRAVENOUS

## 2021-05-05 MED ORDER — LOSARTAN POTASSIUM 25 MG PO TABS
25.0000 mg | ORAL_TABLET | Freq: Every day | ORAL | Status: DC
  Administered 2021-05-05 – 2021-05-07 (×3): 25 mg via ORAL
  Filled 2021-05-05 (×3): qty 1

## 2021-05-05 MED ORDER — NITROGLYCERIN 0.4 MG SL SUBL: 0.4000 mg | SUBLINGUAL_TABLET | SUBLINGUAL | Status: DC | PRN

## 2021-05-05 NOTE — Progress Notes (Signed)
ANTICOAGULATION CONSULT NOTE - Initial Consult  Pharmacy Consult for heparin  Indication: chest pain/ACS  Allergies  Allergen Reactions  . Atorvastatin   . Bupropion   . Rosuvastatin     Patient Measurements: Height: 5\' 9"  (175.3 cm) Weight: 103.4 kg (228 lb) IBW/kg (Calculated) : 66.2 Heparin Dosing Weight: 81 KG  Vital Signs: Temp: 97.2 F (36.2 C) (05/10 1407) Temp Source: Oral (05/10 1407) BP: 203/113 (05/10 1545) Pulse Rate: 104 (05/10 1545)  Labs: Recent Labs    05/05/21 1406  HGB 16.7*  HCT 50.9*  PLT 273  CREATININE 0.99  TROPONINIHS 130*    Estimated Creatinine Clearance: 76.4 mL/min (by C-G formula based on SCr of 0.99 mg/dL).   Medical History: Past Medical History:  Diagnosis Date  . Hyperlipidemia    not on meds  . Rosacea     Assessment: 18 yof presenting to ER with concerns for chest pain. Patient reports episodes of chest pain have been intermittent over the past several days and describes the pain as tightness that is worsened after exertion. EKG with no acute STEMI findings, troponin elevated at 130. Most recent Hgb 16.7 and Scr 0.99.   Goal of Therapy:  Heparin level 0.3-0.7 units/ml Monitor platelets by anticoagulation protocol: Yes   Plan:  Heparin 4000 units x1 followed by heparin 1000 units/hr 8-hour heparin level Daily HL and CBC  Monitor for s/sx of bleeding  Cephus Slater, PharmD, MBA Pharmacy Resident [phone] 05/05/2021 4:00 PM

## 2021-05-05 NOTE — Progress Notes (Signed)
[facility] for Heparin  Indication: chest pain/ACS  Allergies  Allergen Reactions  . Atorvastatin   . Bupropion   . Rosuvastatin     Patient Measurements: Height: 5\' 9"  (175.3 cm) Weight: 102.3 kg (225 lb 9.6 oz) IBW/kg (Calculated) : 66.2 Heparin Dosing Weight: 81 KG  Vital Signs: Temp: 97.2 F (36.2 C) (05/10 1407) Temp Source: Oral (05/10 1407) BP: 158/83 (05/10 2218) Pulse Rate: 103 (05/10 2218)  Labs: Recent Labs    05/05/21 1406 05/05/21 1551 05/05/21 1812 05/05/21 2020 05/05/21 2158  HGB 16.7*  --   --   --   --   HCT 50.9*  --   --   --   --   PLT 273  --   --   --   --   HEPARINUNFRC  --   --   --   --  0.31  CREATININE 0.99  --   --   --   --   TROPONINIHS 130* 150* 173* 182*  --     Estimated Creatinine Clearance: 75.9 mL/min (by C-G formula based on SCr of 0.99 mg/dL).   Medical History: Past Medical History:  Diagnosis Date  . Hyperlipidemia    not on meds  . Rosacea   . Seborrheic keratoses   . Skin cancer     Assessment: 55 yof presenting to ER with concerns for chest pain. Patient reports episodes of chest pain have been intermittent over the past several days and describes the pain as tightness that is worsened after exertion. EKG with no acute STEMI findings, troponin elevated at 130. Most recent Hgb 16.7 and Scr 0.99.   5/10 PM update:  Heparin level therapeutic   Goal of Therapy:  Heparin level 0.3-0.7 units/ml Monitor platelets by anticoagulation protocol: Yes   Plan:  Continue heparin at 1000 units/hr Confirmatory heparin level with AM labs  Narda Bonds, PharmD, [place] Pharmacist Phone: [phone]

## 2021-05-05 NOTE — ED Triage Notes (Signed)
Pt sent here from [facility] for chest pain x4 days. Pt reports her pain started after pressure washing.  Pt reports mid sternum cp, Left arm pain. Denise N/V

## 2021-05-05 NOTE — Interval H&P Note (Signed)
Cath Lab Visit (complete for each Cath Lab visit)  Clinical Evaluation Leading to the Procedure:   ACS: Yes.    Non-ACS:    Anginal Classification: CCS III  Anti-ischemic medical therapy: Minimal Therapy (1 class of medications)  Non-Invasive Test Results: No non-invasive testing performed  Prior CABG: No previous CABG      History and Physical Interval Note:  05/05/2021 8:56 PM  Heather Pace  has presented today for surgery, with the diagnosis of chest pain.  The various methods of treatment have been discussed with the patient and family. After consideration of risks, benefits and other options for treatment, the patient has consented to  Procedure(s): LEFT HEART CATH AND CORONARY ANGIOGRAPHY (N/A) as a surgical intervention.  The patient's history has been reviewed, patient examined, no change in status, stable for surgery.  I have reviewed the patient's chart and labs.  Questions were answered to the patient's satisfaction.     Belva Crome III

## 2021-05-05 NOTE — ED Notes (Signed)
Pt returned from [facility].

## 2021-05-05 NOTE — ED Notes (Signed)
Attempted report x1. 

## 2021-05-05 NOTE — ED Provider Notes (Signed)
[facility] EMERGENCY DEPARTMENT Provider Note   CSN: [id number] Arrival date & time: 05/05/21  1357     History Chief Complaint  Patient presents with  . Chest Pain    Heather Pace is a 60 y.o. female.  Presents ER with concern for chest pain.  Reports that she has a past medical history of hyperlipidemia, positive family history of heart disease but denies any other medical problems.  Reports over the last few days she has been having intermittent episodes of chest pain.  Pain is described as central tightness.  Up to moderate in severity.  Seems to be worsened after exerting herself.  Improved with rest.  Currently discomfort is rated at 3 out of 10 in severity.  Occasionally radiates down her arm but does not radiate down left arm at present.  Does not radiate to back or jaw.  No associated shortness of breath.  Non-smoker.  HPI     Past Medical History:  Diagnosis Date  . Hyperlipidemia    not on meds    There are no problems to display for this patient.   Past Surgical History:  Procedure Laterality Date  . BREAST REDUCTION SURGERY    . herniated disc    . LESION REMOVAL Bilateral 01/18/2018   skin lesion X5  . PLANTAR FASCIECTOMY Bilateral      OB History   No obstetric history on file.     Family History  Problem Relation Age of Onset  . Breast cancer Mother   . Colon cancer Neg Hx   . Colon polyps Neg Hx   . Esophageal cancer Neg Hx   . Rectal cancer Neg Hx   . Stomach cancer Neg Hx     Social History   Tobacco Use  . Smoking status: Never Smoker  . Smokeless tobacco: Never Used  Vaping Use  . Vaping Use: Never used  Substance Use Topics  . Alcohol use: No  . Drug use: No    Home Medications Prior to Admission medications   Not on File    Allergies    Patient has no known allergies.  Review of Systems   Review of Systems  Constitutional: Positive for chills. Negative for fever.  HENT: Negative for ear pain and  sore throat.   Eyes: Negative for pain and visual disturbance.  Respiratory: Negative for cough and shortness of breath.   Cardiovascular: Positive for chest pain. Negative for palpitations.  Gastrointestinal: Negative for abdominal pain and vomiting.  Genitourinary: Negative for dysuria and hematuria.  Musculoskeletal: Negative for arthralgias and back pain.  Skin: Negative for color change and rash.  Neurological: Negative for seizures and syncope.  All other systems reviewed and are negative.   Physical Exam Updated Vital Signs BP (!) 165/94   Pulse 93   Temp (!) 97.2 F (36.2 C) (Oral)   Resp 10   SpO2 99%   Physical Exam Vitals and nursing note reviewed.  Constitutional:      General: She is not in acute distress.    Appearance: She is well-developed.  HENT:     Head: Normocephalic and atraumatic.  Eyes:     Conjunctiva/sclera: Conjunctivae normal.  Cardiovascular:     Rate and Rhythm: Normal rate and regular rhythm.     Heart sounds: No murmur heard.   Pulmonary:     Effort: Pulmonary effort is normal. No respiratory distress.     Breath sounds: Normal breath sounds.  Chest:  Chest wall: No mass or deformity.  Abdominal:     Palpations: Abdomen is soft.     Tenderness: There is no abdominal tenderness.  Musculoskeletal:     Cervical back: Neck supple.  Skin:    General: Skin is warm and dry.  Neurological:     General: No focal deficit present.     Mental Status: She is alert.     ED Results / Procedures / Treatments   Labs (all labs ordered are listed, but only abnormal results are displayed) Labs Reviewed  BASIC METABOLIC PANEL - Abnormal; Notable for the following components:      Result Value   Glucose, Bld 113 (*)    All other components within normal limits  CBC - Abnormal; Notable for the following components:   RBC 5.49 (*)    Hemoglobin 16.7 (*)    HCT 50.9 (*)    All other components within normal limits  TROPONIN I (HIGH  SENSITIVITY) - Abnormal; Notable for the following components:   Troponin I (High Sensitivity) 130 (*)    All other components within normal limits  RESP PANEL BY RT-PCR (FLU A&B, COVID) [id number]  TROPONIN I (HIGH SENSITIVITY)    EKG EKG Interpretation  Date/Time:  Tuesday May 05 2021 14:02:53 EDT Ventricular Rate:  106 PR Interval:  158 QRS Duration: 74 QT Interval:  318 QTC Calculation: 422 R Axis:   70 Text Interpretation: Sinus tachycardia ST & T wave abnormality, consider inferior ischemia Abnormal ECG when compared to prior,faster rate and new t wave inversion in lead 3. NO STEMI Confirmed by Antony Blackbird [phone]) on 05/05/2021 2:30:41 PM   Radiology DG Chest 2 View  Result Date: 05/05/2021 CLINICAL DATA:  Chest pain EXAM: CHEST - 2 VIEW COMPARISON:  None. FINDINGS: Lungs are clear. Heart size and pulmonary vascularity are normal. No adenopathy. No pneumothorax. There is mild midthoracic levoscoliosis. IMPRESSION: Lungs clear.  Cardiac silhouette normal. Electronically Signed   By: Lowella Grip III M.D.   On: 05/05/2021 15:35    Procedures .Critical Care Performed by: Lucrezia Starch, MD Authorized by: Lucrezia Starch, MD   Critical care provider statement:    Critical care time (minutes):  41   Critical care was necessary to treat or prevent imminent or life-threatening deterioration of the following conditions:  Cardiac failure   Critical care was time spent personally by me on the following activities:  Discussions with consultants, evaluation of patient's response to treatment, examination of patient, ordering and performing treatments and interventions, ordering and review of laboratory studies, ordering and review of radiographic studies, pulse oximetry, re-evaluation of patient's condition, obtaining history from patient or surrogate and review of old charts     Medications Ordered in ED Medications  nitroGLYCERIN (NITROSTAT) SL tablet 0.4 mg (has no  administration in time range)  fentaNYL (SUBLIMAZE) injection 50 mcg (has no administration in time range)  aspirin chewable tablet 324 mg (324 mg Oral Given 05/05/21 1415)    ED Course  I have reviewed the triage vital signs and the nursing notes.  Pertinent labs & imaging results that were available during my care of the patient were reviewed by me and considered in my medical decision making (see chart for details).    MDM Rules/Calculators/A&P                         60 year old lady presenting to ER with concern for chest pain.  Exertional in nature.  Currently having some mild rest pain.  Story highly concerning for cardiac etiology.  EKG does not have acute STEMI but does demonstrate some subtle ST depressions and mild elevation in aVR.  Troponin was mildly elevated at 130.  Findings consistent with ACS/NSTEMI.  Consulted cardiology for admission and further management. Started heparin.   Final Clinical Impression(s) / ED Diagnoses Final diagnoses:  Acute coronary syndrome [facility])  NSTEMI (non-ST elevated myocardial infarction) [facility])    Rx / DC Orders ED Discharge Orders    None       Lucrezia Starch, MD 05/05/21 1551

## 2021-05-05 NOTE — ED Notes (Signed)
Pt to xray

## 2021-05-05 NOTE — ED Provider Notes (Addendum)
Emergency Medicine Provider Triage Evaluation Note  Heather Pace , a 60 y.o. female  was evaluated in triage.  Pt complains of chest pain onset Saturday. Worse with exertion. Substernal with left upper arm soreness.  Seen at [facility] sent to ER for abnormal EKG. Strong family history of CAD. No history of HTN but very hypertensive in triage. at home Bps are 443X systolic. States she has white coat syndrome. Active CP 8/10.   Review of Systems  Positive: Chest pain, shortness of breath Negative: Nausea, vomiting, diaphoresis, leg swelling  Physical Exam  There were no vitals taken for this visit. Gen:   Awake, no distress   Resp:  Normal effort  MSK:   Moves extremities without difficulty  Card:  No LE edema  Medical Decision Making  Medically screening exam initiated at 2:06 PM.  Appropriate orders placed.  BATOOL MAJID was informed that the remainder of the evaluation will be completed by another provider, this initial triage assessment does not replace that evaluation, and the importance of remaining in the ED until their evaluation is complete.  EKG has questionable depression in I, II, avl with nonspecific TW changes in lateral leads III Asked triage RN to prioritize for room Aspirin given      Kinnie Feil, PA-C 05/05/21 1411    Lennice Sites, DO 05/05/21 1618

## 2021-05-05 NOTE — ED Notes (Signed)
Cardiology PA at bedside. 

## 2021-05-05 NOTE — H&P (Addendum)
Cardiology Admission History and Physical:   Patient ID: Heather Pace MRN: [id number]; DOB: 1961-03-26   Admission date: 05/05/2021  PCP:  Heather Arabian, MD   [facility] Providers Cardiologist:  None      Chief Complaint:  Chest pain  Patient Profile:   Heather Pace is a 60 y.o. female with hx HLD, skin issues, no dx HTN but has "white coat syndrome" who is being seen 05/05/2021 for the evaluation of chest pain.   History of Present Illness:   Heather Pace was fine until Saturday, 05/07. She had been pressure-washing on Thursday and Friday. Some fatigue and general soreness but no chest pain.   On Saturday, she developed chest tightness, central chest while picking up some small limbs. The tightness was a 7-8/10. No associated symptoms, did not try any meds. Sx lasted <10 minutes, relieved by rest.  She took it easy the rest of the day, did ok.   She laid down, but that made the chest pain come back. Same intensity, still no associated sx. She raised the head of the bed, but just could not get comfortable. She took Tylenol, but is not sure it helped. She had her head kind of high, and was able to get some sleep. She felt anxious because of the pain.   She felt better when she woke up, but her chest started hurting again when she got up and was moving around. She did not do much on Sunday.   She took the trashcan out Monday night, had to walk up a hill and had the worst episode of pain yet. She started thinking that it was not Heather or GI, might be more serious.   Every time she tries to do anything, she will start to get the pain, can relieve it by rest.   She had a shingles immunization scheduled today, but canceled it.   She went to an Urgent Care and they sent her to the ER.  Heather Pace follows her BP at home, especially before MD appt. She generally gets readings in the 120s-150s, more on the lower range.   Past Medical History:  Diagnosis Date  . Hyperlipidemia     not on meds  . Rosacea   . Seborrheic keratoses   . Skin cancer     Past Surgical History:  Procedure Laterality Date  . BACK SURGERY  2002   Hemilaminotomy right L5-S1 with disk excision and S1 nerve root  . BREAST REDUCTION SURGERY    . COLONOSCOPY  2019  . LESION REMOVAL Bilateral 01/18/2018   skin lesion X5  . PLANTAR FASCIECTOMY Bilateral      Medications Prior to Admission: Prior to Admission medications   Not on File     Allergies:    Allergies  Allergen Reactions  . Atorvastatin   . Bupropion   . Rosuvastatin     Social History:   Social History   Socioeconomic History  . Marital status: Single    Spouse name: Not on file  . Number of children: Not on file  . Years of education: Not on file  . Highest education level: Not on file  Occupational History  . Not on file  Tobacco Use  . Smoking status: Never Smoker  . Smokeless tobacco: Never Used  Vaping Use  . Vaping Use: Never used  Substance and Sexual Activity  . Alcohol use: No  . Drug use: No  . Sexual activity: Not on file  Other Topics  Concern  . Not on file  Social History Narrative  . Not on file   Social Determinants of Health   Financial Resource Strain: Not on file  Food Insecurity: Not on file  Transportation Needs: Not on file  Physical Activity: Not on file  Stress: Not on file  Social Connections: Not on file  Intimate Partner Violence: Not on file    Family History:   The patient's family history includes Breast cancer (age of onset: 53) in her mother; Heart attack (age of onset: 56) in her father; Heart disease in her brother. There is no history of Colon cancer, Colon polyps, Esophageal cancer, Rectal cancer, or Stomach cancer.    ROS:  Please see the history of present illness.  All other ROS reviewed and negative.     Physical Exam/Data:   Vitals:   05/05/21 1552 05/05/21 1600 05/05/21 1615 05/05/21 1630  BP:  (!) 197/117 (!) 164/101 (!) 169/90  Pulse:  (!) 103  92 89  Resp:  15 17 14   Temp:      TempSrc:      SpO2:  100% 97% 96%  Weight: 103.4 kg     Height: 5\' 9"  (1.753 m)      No intake or output data in the 24 hours ending 05/05/21 1646 Last 3 Weights 05/05/2021 01/20/2018 01/06/2018  Weight (lbs) 228 lb 219 lb 219 lb 12.8 oz  Weight (kg) 103.42 kg 99.338 kg 99.701 kg     Body mass index is 33.67 kg/m.  General:  Well nourished, well developed, in no acute distress HEENT: normal Lymph: no adenopathy Neck: no JVD Endocrine:  No thryomegaly Vascular: No carotid bruits; 4/4 extremity pulses 2+ bilaterally without bruits  Cardiac:  normal S1, S2; RRR; no murmur  Lungs:  clear to auscultation bilaterally, no wheezing, rhonchi or rales  Abd: soft, nontender, no hepatomegaly  Ext: no edema Musculoskeletal:  No deformities, BUE and BLE strength normal and equal Skin: warm and dry  Neuro:  CNs 2-12 intact, no focal abnormalities noted Psych:  Normal affect    EKG:  The ECG that was done today was personally reviewed and demonstrates ST, HR 106, ST depression in I, II, aVL, ST elevation in aVR and T wave inversions in III. Repeat ECG after SL NTG x 1 and fentanyl shows improvement in the ST/Twave changes, but still a little abnormal.   Relevant CV Studies: none  Laboratory Data:  High Sensitivity Troponin:   Recent Labs  Lab 05/05/21 1406  TROPONINIHS 130*      Chemistry Recent Labs  Lab 05/05/21 1406  NA 136  K 3.8  CL 104  CO2 24  GLUCOSE 113*  BUN 11  CREATININE 0.99  CALCIUM 9.4  GFRNONAA >60  ANIONGAP 8    No results for input(s): PROT, ALBUMIN, AST, ALT, ALKPHOS, BILITOT in the last 168 hours. Hematology Recent Labs  Lab 05/05/21 1406  WBC 7.5  RBC 5.49*  HGB 16.7*  HCT 50.9*  MCV 92.7  MCH 30.4  MCHC 32.8  RDW 13.2  PLT 273   BNPNo results for input(s): BNP, PROBNP in the last 168 hours.  DDimer No results for input(s): DDIMER in the last 168 hours. Lab Results  Component Value Date   TSH 1.73  10/24/2007   No results found for: HGBA1C No results found for: CHOL, HDL, LDLCALC, LDLDIRECT, TRIG, CHOLHDL   Radiology/Studies:  DG Chest 2 View  Result Date: 05/05/2021 CLINICAL DATA:  Chest pain EXAM:  CHEST - 2 VIEW COMPARISON:  None. FINDINGS: Lungs are clear. Heart size and pulmonary vascularity are normal. No adenopathy. No pneumothorax. There is mild midthoracic levoscoliosis. IMPRESSION: Lungs clear.  Cardiac silhouette normal. Electronically Signed   By: Lowella Grip III M.D.   On: 05/05/2021 15:35     Assessment and Plan:   1. Unstable anginal pain, ?NSTEMI - admit, heparin, nitro (to help CP and BP) - Cath tomorrow - Cardiac catheterization was discussed with the patient fully. The patient understands that risks include but are not limited to stroke (1 in 1000), death (1 in 65), kidney failure [usually temporary] (1 in 500), bleeding (1 in 200), allergic reaction [possibly serious] (1 in 200).  The patient understands and is willing to proceed.    2. HLD - ck profile, may need rx - if has been intol statins, may need Repatha - pt has been reluctant to take statins in the past, try Lipitor 80 mg qd  3. HTN - not a dx for her and she says BP is ok at home, c/o "white coat syndrome" when seeing MD, says this is stressful for her - continue to follow BP - go ahead and start BB, Lopressor 25 mg bid and 25 mg Losart   Risk Assessment/Risk Scores:  }  TIMI Risk Score for Unstable Angina or Non-ST Elevation MI:   The patient's TIMI risk score is 4, which indicates a 20% risk of all cause mortality, new or recurrent myocardial infarction or need for urgent revascularization in the next 14 days.   For questions or updates, please contact [place] Please consult [email or web address] for contact info under     Signed, Rosaria Ferries, PA-C  05/05/2021 4:46 PM

## 2021-05-06 ENCOUNTER — Encounter (HOSPITAL_COMMUNITY): Payer: Self-pay | Admitting: Interventional Cardiology

## 2021-05-06 ENCOUNTER — Inpatient Hospital Stay (HOSPITAL_COMMUNITY): Payer: BLUE CROSS/BLUE SHIELD

## 2021-05-06 ENCOUNTER — Inpatient Hospital Stay (HOSPITAL_COMMUNITY)
Admission: EM | Disposition: A | Discharge status: Home / Self Care | Payer: Self-pay | Source: Home / Self Care | Attending: Cardiovascular Disease

## 2021-05-06 DIAGNOSIS — R079 Chest pain, unspecified: Secondary | ICD-10-CM

## 2021-05-06 DIAGNOSIS — I25750 Atherosclerosis of native coronary artery of transplanted heart with unstable angina: Secondary | ICD-10-CM

## 2021-05-06 DIAGNOSIS — I25110 Atherosclerotic heart disease of native coronary artery with unstable angina pectoris: Secondary | ICD-10-CM

## 2021-05-06 DIAGNOSIS — E782 Mixed hyperlipidemia: Secondary | ICD-10-CM

## 2021-05-06 HISTORY — PX: LEFT HEART CATH AND CORONARY ANGIOGRAPHY: CATH118249

## 2021-05-06 HISTORY — PX: CORONARY STENT INTERVENTION: CATH118234

## 2021-05-06 LAB — CBC
HCT: 46.2 % — ABNORMAL HIGH (ref 36.0–46.0)
Hemoglobin: 15.3 g/dL — ABNORMAL HIGH (ref 12.0–15.0)
MCH: 30.5 pg (ref 26.0–34.0)
MCHC: 33.1 g/dL (ref 30.0–36.0)
MCV: 92 fL (ref 80.0–100.0)
Platelets: 242 K/uL (ref 150–400)
RBC: 5.02 MIL/uL (ref 3.87–5.11)
RDW: 13.6 % (ref 11.5–15.5)
WBC: 8.6 K/uL (ref 4.0–10.5)
nRBC: 0 % (ref 0.0–0.2)

## 2021-05-06 LAB — POCT ACTIVATED CLOTTING TIME
Activated Clotting Time: 315 seconds
Activated Clotting Time: 267 seconds

## 2021-05-06 LAB — BASIC METABOLIC PANEL
Anion gap: 10 (ref 5–15)
BUN: 11 mg/dL (ref 8–23)
CO2: 23 mmol/L (ref 22–32)
Calcium: 8.9 mg/dL (ref 8.9–10.3)
Chloride: 103 mmol/L (ref 98–111)
Creatinine, Ser: 0.96 mg/dL (ref 0.44–1.00)
GFR, Estimated: >60 mL/min (ref >60)
Glucose, Bld: 121 mg/dL — ABNORMAL HIGH (ref 70–99)
Potassium: 3.5 mmol/L (ref 3.5–5.1)
Sodium: 136 mmol/L (ref 135–145)

## 2021-05-06 LAB — LIPID PANEL
Cholesterol: 301 mg/dL — ABNORMAL HIGH (ref 0–200)
HDL: 52 mg/dL (ref >40)
LDL Cholesterol: 213 mg/dL — ABNORMAL HIGH (ref 0–99)
Total CHOL/HDL Ratio: 5.8 RATIO
Triglycerides: 178 mg/dL — ABNORMAL HIGH (ref <150)
VLDL: 36 mg/dL (ref 0–40)

## 2021-05-06 LAB — ECHOCARDIOGRAM COMPLETE
Area-P 1/2: 2.22 cm2
Height: 69 in
S' Lateral: 3.1 cm
Weight: 3620.8 oz

## 2021-05-06 LAB — HEPARIN LEVEL (UNFRACTIONATED): Heparin Unfractionated: 0.14 IU/mL — ABNORMAL LOW (ref 0.30–0.70)

## 2021-05-06 SURGERY — LEFT HEART CATH AND CORONARY ANGIOGRAPHY
Anesthesia: LOCAL

## 2021-05-06 MED ORDER — FENTANYL CITRATE (PF) 100 MCG/2ML IJ SOLN
INTRAMUSCULAR | Status: AC
  Filled 2021-05-06: qty 2

## 2021-05-06 MED ORDER — NITROGLYCERIN 1 MG/10 ML FOR IR/CATH LAB
INTRA_ARTERIAL | Status: AC
  Filled 2021-05-06: qty 10

## 2021-05-06 MED ORDER — OXYCODONE HCL 5 MG PO TABS: 5.0000 mg | ORAL_TABLET | ORAL | Status: DC | PRN

## 2021-05-06 MED ORDER — MIDAZOLAM HCL 2 MG/2ML IJ SOLN
INTRAMUSCULAR | Status: DC | PRN
  Administered 2021-05-06: 1 mg via INTRAVENOUS
  Administered 2021-05-06: 0.5 mg via INTRAVENOUS
  Administered 2021-05-06: 1 mg via INTRAVENOUS
  Administered 2021-05-06: 0.5 mg via INTRAVENOUS

## 2021-05-06 MED ORDER — HYDRALAZINE HCL 20 MG/ML IJ SOLN: 10.0000 mg | INTRAMUSCULAR | Status: AC | PRN

## 2021-05-06 MED ORDER — SODIUM CHLORIDE 0.9% FLUSH: 3.0000 mL | INTRAVENOUS | Status: DC | PRN

## 2021-05-06 MED ORDER — MIDAZOLAM HCL 2 MG/2ML IJ SOLN
INTRAMUSCULAR | Status: AC
  Filled 2021-05-06: qty 2

## 2021-05-06 MED ORDER — ASPIRIN 81 MG PO CHEW
81.0000 mg | CHEWABLE_TABLET | Freq: Every day | ORAL | Status: DC
  Administered 2021-05-07: 81 mg via ORAL
  Filled 2021-05-06: qty 1

## 2021-05-06 MED ORDER — LIDOCAINE HCL (PF) 1 % IJ SOLN
INTRAMUSCULAR | Status: AC
  Filled 2021-05-06: qty 30

## 2021-05-06 MED ORDER — ACETAMINOPHEN 325 MG PO TABS
650.0000 mg | ORAL_TABLET | ORAL | Status: DC | PRN
  Administered 2021-05-06 (×2): 650 mg via ORAL
  Filled 2021-05-06 (×2): qty 2

## 2021-05-06 MED ORDER — HEPARIN SODIUM (PORCINE) 1000 UNIT/ML IJ SOLN
INTRAMUSCULAR | Status: AC
  Filled 2021-05-06: qty 1

## 2021-05-06 MED ORDER — CLOPIDOGREL BISULFATE 75 MG PO TABS
75.0000 mg | ORAL_TABLET | Freq: Every day | ORAL | Status: DC
  Administered 2021-05-07: 75 mg via ORAL
  Filled 2021-05-06: qty 1

## 2021-05-06 MED ORDER — LIDOCAINE HCL (PF) 1 % IJ SOLN
INTRAMUSCULAR | Status: DC | PRN
Start: 2021-05-06 — End: 2021-05-06
  Administered 2021-05-06: 3 mL

## 2021-05-06 MED ORDER — LABETALOL HCL 5 MG/ML IV SOLN: 10.0000 mg | INTRAVENOUS | Status: AC | PRN

## 2021-05-06 MED ORDER — SODIUM CHLORIDE 0.9 % IV SOLN: INTRAVENOUS | Status: AC

## 2021-05-06 MED ORDER — SODIUM CHLORIDE 0.9% FLUSH
3.0000 mL | Freq: Two times a day (BID) | INTRAVENOUS | Status: DC
  Administered 2021-05-06 – 2021-05-07 (×3): 3 mL via INTRAVENOUS

## 2021-05-06 MED ORDER — ENOXAPARIN SODIUM 40 MG/0.4ML IJ SOSY: 40.0000 mg | PREFILLED_SYRINGE | INTRAMUSCULAR | Status: DC

## 2021-05-06 MED ORDER — FAMOTIDINE IN NACL 20-0.9 MG/50ML-% IV SOLN
INTRAVENOUS | Status: AC | PRN
  Administered 2021-05-06: 20 mg via INTRAVENOUS

## 2021-05-06 MED ORDER — HEPARIN SODIUM (PORCINE) 1000 UNIT/ML IJ SOLN
INTRAMUSCULAR | Status: DC | PRN
  Administered 2021-05-06: 7000 [IU] via INTRAVENOUS
  Administered 2021-05-06: 5000 [IU] via INTRAVENOUS
  Administered 2021-05-06: 2500 [IU] via INTRAVENOUS

## 2021-05-06 MED ORDER — NITROGLYCERIN IN D5W 200-5 MCG/ML-% IV SOLN
INTRAVENOUS | Status: AC | PRN
  Administered 2021-05-06: 5 ug/min via INTRAVENOUS

## 2021-05-06 MED ORDER — CLOPIDOGREL BISULFATE 300 MG PO TABS
ORAL_TABLET | ORAL | Status: AC
  Filled 2021-05-06: qty 2

## 2021-05-06 MED ORDER — FENTANYL CITRATE (PF) 100 MCG/2ML IJ SOLN
INTRAMUSCULAR | Status: DC | PRN
  Administered 2021-05-06: 50 ug via INTRAVENOUS
  Administered 2021-05-06 (×3): 25 ug via INTRAVENOUS

## 2021-05-06 MED ORDER — HEPARIN (PORCINE) IN NACL 1000-0.9 UT/500ML-% IV SOLN
INTRAVENOUS | Status: AC
  Filled 2021-05-06: qty 1000

## 2021-05-06 MED ORDER — SODIUM CHLORIDE 0.9 % IV SOLN: 250.0000 mL | INTRAVENOUS | Status: DC | PRN

## 2021-05-06 MED ORDER — HEPARIN (PORCINE) IN NACL 1000-0.9 UT/500ML-% IV SOLN
INTRAVENOUS | Status: DC | PRN
Start: 2021-05-06 — End: 2021-05-06
  Administered 2021-05-06 (×3): 500 mL

## 2021-05-06 MED ORDER — CLOPIDOGREL BISULFATE 300 MG PO TABS
ORAL_TABLET | ORAL | Status: DC | PRN
  Administered 2021-05-06: 600 mg via ORAL

## 2021-05-06 MED ORDER — ONDANSETRON HCL 4 MG/2ML IJ SOLN
4.0000 mg | Freq: Four times a day (QID) | INTRAMUSCULAR | Status: DC | PRN
  Administered 2021-05-06: 4 mg via INTRAVENOUS

## 2021-05-06 MED ORDER — VERAPAMIL HCL 2.5 MG/ML IV SOLN
INTRAVENOUS | Status: AC
  Filled 2021-05-06: qty 2

## 2021-05-06 MED ORDER — NITROGLYCERIN 1 MG/10 ML FOR IR/CATH LAB
INTRA_ARTERIAL | Status: DC | PRN
  Administered 2021-05-06: 200 ug via INTRACORONARY

## 2021-05-06 MED ORDER — IOHEXOL 350 MG/ML SOLN
INTRAVENOUS | Status: DC | PRN
Start: 2021-05-06 — End: 2021-05-06
  Administered 2021-05-06: 185 mL via INTRA_ARTERIAL

## 2021-05-06 MED ORDER — FAMOTIDINE IN NACL 20-0.9 MG/50ML-% IV SOLN
INTRAVENOUS | Status: AC
  Filled 2021-05-06: qty 50

## 2021-05-06 SURGICAL SUPPLY — 26 items
BALLN SAPPHIRE 3.0X12 (BALLOONS) ×2
BALLN SAPPHIRE ~~LOC~~ 3.25X12 (BALLOONS) ×1 IMPLANT
BALLOON SAPPHIRE 3.0X12 (BALLOONS) IMPLANT
CATH 5FR JL3.5 JR4 ANG PIG MP (CATHETERS) ×1 IMPLANT
CATH INFINITI 5FR AL1 (CATHETERS) ×2 IMPLANT
CATH INFINITI 5FR JL4 (CATHETERS) ×1 IMPLANT
CATH INFINITI 5FR MPB2 (CATHETERS) ×1 IMPLANT
CATH LAUNCHER 5F EBU3.0 (CATHETERS) IMPLANT
CATH LAUNCHER 5F EBU3.5 (CATHETERS) ×1 IMPLANT
CATH LAUNCHER 6FR JR4 (CATHETERS) ×1 IMPLANT
CATHETER LAUNCHER 5F EBU3.0 (CATHETERS) ×2
DEVICE RAD TR BAND REGULAR (VASCULAR PRODUCTS) ×1 IMPLANT
ELECT DEFIB PAD ADLT CADENCE (PAD) ×1 IMPLANT
GLIDESHEATH SLEND A-KIT 6F 22G (SHEATH) ×1 IMPLANT
GUIDEWIRE INQWIRE 1.5J.035X260 (WIRE) IMPLANT
INQWIRE 1.5J .035X260CM (WIRE) ×2
KIT ENCORE 26 ADVANTAGE (KITS) ×1 IMPLANT
KIT HEART LEFT (KITS) ×2 IMPLANT
PACK CARDIAC CATHETERIZATION (CUSTOM PROCEDURE TRAY) ×2 IMPLANT
STENT SYNERGY XD 3.0X8 (Permanent Stent) IMPLANT
STENT SYNERGY XD 3.50X16 (Permanent Stent) IMPLANT
SYNERGY XD 3.0X8 (Permanent Stent) ×2 IMPLANT
SYNERGY XD 3.50X16 (Permanent Stent) ×2 IMPLANT
TRANSDUCER W/STOPCOCK (MISCELLANEOUS) ×2 IMPLANT
TUBING CIL FLEX 10 FLL-RA (TUBING) ×2 IMPLANT
WIRE ASAHI PROWATER 180CM (WIRE) ×1 IMPLANT

## 2021-05-06 NOTE — Progress Notes (Signed)
[facility] for Heparin  Indication: chest pain/ACS  Allergies  Allergen Reactions  . Atorvastatin   . Bupropion   . Rosuvastatin     Patient Measurements: Height: 5\' 9"  (818.5 cm) Weight: 102.3 kg (225 lb 9.6 oz) IBW/kg (Calculated) : 66.2 Heparin Dosing Weight: 81 KG  Vital Signs: Temp: 98 F (36.7 C) (05/10 2011) Temp Source: Oral (05/10 2011) BP: 96/65 (05/11 0052) Pulse Rate: 103 (05/10 2218)  Labs: Recent Labs    05/05/21 1406 05/05/21 1551 05/05/21 1812 05/05/21 2020 05/05/21 2158 05/06/21 0251  HGB 16.7*  --   --   --   --  15.3*  HCT 50.9*  --   --   --   --  46.2*  PLT 273  --   --   --   --  242  HEPARINUNFRC  --   --   --   --  0.31 0.14*  CREATININE 0.99  --   --   --   --  0.96  TROPONINIHS 130* 150* 173* 182*  --   --     Estimated Creatinine Clearance: 78.3 mL/min (by C-G formula based on SCr of 0.96 mg/dL).   Medical History: Past Medical History:  Diagnosis Date  . Hyperlipidemia    not on meds  . Rosacea   . Seborrheic keratoses   . Skin cancer     Assessment: 1 yof presenting to ER with concerns for chest pain. Patient reports episodes of chest pain have been intermittent over the past several days and describes the pain as tightness that is worsened after exertion. EKG with no acute STEMI findings, troponin elevated at 130. Most recent Hgb 16.7 and Scr 0.99.   5/11 AM update:  Heparin level low  Goal of Therapy:  Heparin level 0.3-0.7 units/ml Monitor platelets by anticoagulation protocol: Yes   Plan:  Inc heparin to 1150 units/hr 1200 heparin level  Narda Bonds, PharmD, BCPS Clinical Pharmacist Phone: [phone]

## 2021-05-06 NOTE — Plan of Care (Signed)
  Problem: Education: Goal: Understanding of CV disease, CV risk reduction, and recovery process will improve Outcome: Progressing Goal: Individualized Educational Video(s) Outcome: Progressing   Problem: Activity: Goal: Ability to return to baseline activity level will improve Outcome: Progressing   Problem: Cardiovascular: Goal: Ability to achieve and maintain adequate cardiovascular perfusion will improve Outcome: Progressing Goal: Vascular access site(s) Level 0-1 will be maintained Outcome: Progressing   Problem: Health Behavior/Discharge Planning: Goal: Ability to safely manage health-related needs after discharge will improve Outcome: Progressing   Problem: Education: Goal: Knowledge of General Education information will improve Description: Including pain rating scale, medication(s)/side effects and non-pharmacologic comfort measures Outcome: Progressing   Problem: Health Behavior/Discharge Planning: Goal: Ability to manage health-related needs will improve Outcome: Progressing   Problem: Clinical Measurements: Goal: Will remain free from infection Outcome: Progressing Goal: Diagnostic test results will improve Outcome: Progressing Goal: Respiratory complications will improve Outcome: Progressing   Problem: Activity: Goal: Risk for activity intolerance will decrease Outcome: Progressing   Problem: Nutrition: Goal: Adequate nutrition will be maintained Outcome: Progressing   Problem: Coping: Goal: Level of anxiety will decrease Outcome: Progressing   Problem: Elimination: Goal: Will not experience complications related to bowel motility Outcome: Progressing Goal: Will not experience complications related to urinary retention Outcome: Progressing   Problem: Pain Managment: Goal: General experience of comfort will improve Outcome: Progressing   Problem: Safety: Goal: Ability to remain free from injury will improve Outcome: Progressing   Problem:  Skin Integrity: Goal: Risk for impaired skin integrity will decrease Outcome: Progressing

## 2021-05-06 NOTE — Progress Notes (Signed)
Pt c/o chest pain/pressure.  EKG no changes. Titrated nitro drip from 10 mcg to 25 mcg with good effect.  Cardiology [place], [place] made aware.  Idolina Primer, RN

## 2021-05-06 NOTE — Progress Notes (Signed)
  Echocardiogram 2D Echocardiogram has been performed.  Idona Stach G Apphia Cropley 05/06/2021, 4:10 PM

## 2021-05-06 NOTE — CV Procedure (Signed)
   Unstable angina pectoris presentation  High-grade mid RCA, 99% overlapping stents mid to proximal RCA.  The second more proximal overlapping stent was due to edge dissection.  Transient occlusion of the large right ventricular branch at open with IC nitro.  Left coronary to never be selectively engaged.  Left main is normal.  Proximal LAD is normal.  Proximal circumflex is normal.  Distal luminal irregularities noted in the circumflex.  Multiple catheters were used in an attempt to engage the native left coronary.  Aortic/innominate artery angulation prevented engagement.    If angiography required in the future especially if there is concern about the left coronary, would recommend femoral cath.

## 2021-05-06 NOTE — Progress Notes (Signed)
Subjective:  Denies SSCP, palpitations or Dyspnea All questions answered regarding cath   Objective:  Vitals:   05/05/21 2352 05/06/21 0052 05/06/21 0425 05/06/21 0736  BP: 105/67 96/65 131/74 127/79  Pulse:   75 69  Resp: 16  18 18   Temp:   98.1 F (36.7 C) 98.1 F (36.7 C)  TempSrc:   Oral Oral  SpO2: 98%  100% 100%  Weight:   102.6 kg   Height:        Intake/Output from previous day:  Intake/Output Summary (Last 24 hours) at 05/06/2021 8938 Last data filed at 05/06/2021 0630 Gross per 24 hour  Intake 989.59 ml  Output 200 ml  Net 789.59 ml    Physical Exam: Affect appropriate Healthy:  appears stated age HEENT: normal Neck supple with no adenopathy JVP normal no bruits no thyromegaly Lungs clear with no wheezing and good diaphragmatic motion Heart:  S1/S2 no murmur, no rub, gallop or click PMI normal Abdomen: benighn, BS positve, no tenderness, no AAA no bruit.  No HSM or HJR Distal pulses intact with no bruits No edema Neuro non-focal Skin warm and dry No muscular weakness   Lab Results: Basic Metabolic Panel: Recent Labs    05/05/21 1406 05/06/21 0251  NA 136 136  K 3.8 3.5  CL 104 103  CO2 24 23  GLUCOSE 113* 121*  BUN 11 11  CREATININE 0.99 0.96  CALCIUM 9.4 8.9   Liver Function Tests: Recent Labs    05/05/21 1812  AST 31  ALT 38  ALKPHOS 81  BILITOT 1.0  PROT 7.4  ALBUMIN 4.3   No results for input(s): LIPASE, AMYLASE in the last 72 hours. CBC: Recent Labs    05/05/21 1406 05/06/21 0251  WBC 7.5 8.6  HGB 16.7* 15.3*  HCT 50.9* 46.2*  MCV 92.7 92.0  PLT 273 242   Cardiac Enzymes: No results for input(s): CKTOTAL, CKMB, CKMBINDEX, TROPONINI in the last 72 hours. BNP: Invalid input(s): POCBNP D-Dimer: No results for input(s): DDIMER in the last 72 hours. Hemoglobin A1C: Recent Labs    05/05/21 1812  HGBA1C 6.3*   Fasting Lipid Panel: Recent Labs    05/06/21 0251  CHOL 301*  HDL 52  LDLCALC 213*  TRIG 178*   CHOLHDL 5.8   Thyroid Function Tests: Recent Labs    05/05/21 1812  TSH 1.932   Anemia Panel: No results for input(s): VITAMINB12, FOLATE, FERRITIN, TIBC, IRON, RETICCTPCT in the last 72 hours.  Imaging: DG Chest 2 View  Result Date: 05/05/2021 CLINICAL DATA:  Chest pain EXAM: CHEST - 2 VIEW COMPARISON:  None. FINDINGS: Lungs are clear. Heart size and pulmonary vascularity are normal. No adenopathy. No pneumothorax. There is mild midthoracic levoscoliosis. IMPRESSION: Lungs clear.  Cardiac silhouette normal. Electronically Signed   By: Lowella Grip III M.D.   On: 05/05/2021 15:35    Cardiac Studies:  ECG: SR inferior T wave changes    Telemetry:  NSR 05/06/2021   Echo:   Medications:   . atorvastatin  80 mg Oral Daily  . losartan  25 mg Oral Daily  . metoprolol tartrate  25 mg Oral BID  . sodium chloride flush  3 mL Intravenous Q12H  . sodium chloride flush  3 mL Intravenous Q12H     . sodium chloride    . sodium chloride    . sodium chloride 1 mL/kg/hr (05/06/21 0505)  . heparin Stopped (05/06/21 1017)  . nitroGLYCERIN Stopped (05/06/21 0051)  Assessment/Plan:   1. Chest Pain:  Obese female with unRx HTN and HLD with strong family history of premature CAD. ECG with inferior T  Wave changes and troponin peak 182 For cath today   2. HTN:  ARB and beta blocker started improved  3. HLD  Started on lipitor noted "intolerance" in past with ? Diarrhea but sounds like she just didn't want to be on medication     Jenkins Rouge 05/06/2021, 8:28 AM

## 2021-05-06 NOTE — H&P (View-Only) (Signed)
Subjective:  Denies SSCP, palpitations or Dyspnea All questions answered regarding cath   Objective:  Vitals:   05/05/21 2352 05/06/21 0052 05/06/21 0425 05/06/21 0736  BP: 105/67 96/65 131/74 127/79  Pulse:   75 69  Resp: 16  18 18   Temp:   98.1 F (36.7 C) 98.1 F (36.7 C)  TempSrc:   Oral Oral  SpO2: 98%  100% 100%  Weight:   102.6 kg   Height:        Intake/Output from previous day:  Intake/Output Summary (Last 24 hours) at 05/06/2021 7209 Last data filed at 05/06/2021 0630 Gross per 24 hour  Intake 989.59 ml  Output 200 ml  Net 789.59 ml    Physical Exam: Affect appropriate Healthy:  appears stated age HEENT: normal Neck supple with no adenopathy JVP normal no bruits no thyromegaly Lungs clear with no wheezing and good diaphragmatic motion Heart:  S1/S2 no murmur, no rub, gallop or click PMI normal Abdomen: benighn, BS positve, no tenderness, no AAA no bruit.  No HSM or HJR Distal pulses intact with no bruits No edema Neuro non-focal Skin warm and dry No muscular weakness   Lab Results: Basic Metabolic Panel: Recent Labs    05/05/21 1406 05/06/21 0251  NA 136 136  K 3.8 3.5  CL 104 103  CO2 24 23  GLUCOSE 113* 121*  BUN 11 11  CREATININE 0.99 0.96  CALCIUM 9.4 8.9   Liver Function Tests: Recent Labs    05/05/21 1812  AST 31  ALT 38  ALKPHOS 81  BILITOT 1.0  PROT 7.4  ALBUMIN 4.3   No results for input(s): LIPASE, AMYLASE in the last 72 hours. CBC: Recent Labs    05/05/21 1406 05/06/21 0251  WBC 7.5 8.6  HGB 16.7* 15.3*  HCT 50.9* 46.2*  MCV 92.7 92.0  PLT 273 242   Cardiac Enzymes: No results for input(s): CKTOTAL, CKMB, CKMBINDEX, TROPONINI in the last 72 hours. BNP: Invalid input(s): POCBNP D-Dimer: No results for input(s): DDIMER in the last 72 hours. Hemoglobin A1C: Recent Labs    05/05/21 1812  HGBA1C 6.3*   Fasting Lipid Panel: Recent Labs    05/06/21 0251  CHOL 301*  HDL 52  LDLCALC 213*  TRIG 178*   CHOLHDL 5.8   Thyroid Function Tests: Recent Labs    05/05/21 1812  TSH 1.932   Anemia Panel: No results for input(s): VITAMINB12, FOLATE, FERRITIN, TIBC, IRON, RETICCTPCT in the last 72 hours.  Imaging: DG Chest 2 View  Result Date: 05/05/2021 CLINICAL DATA:  Chest pain EXAM: CHEST - 2 VIEW COMPARISON:  None. FINDINGS: Lungs are clear. Heart size and pulmonary vascularity are normal. No adenopathy. No pneumothorax. There is mild midthoracic levoscoliosis. IMPRESSION: Lungs clear.  Cardiac silhouette normal. Electronically Signed   By: Lowella Grip III M.D.   On: 05/05/2021 15:35    Cardiac Studies:  ECG: SR inferior T wave changes    Telemetry:  NSR 05/06/2021   Echo:   Medications:   . atorvastatin  80 mg Oral Daily  . losartan  25 mg Oral Daily  . metoprolol tartrate  25 mg Oral BID  . sodium chloride flush  3 mL Intravenous Q12H  . sodium chloride flush  3 mL Intravenous Q12H     . sodium chloride    . sodium chloride    . sodium chloride 1 mL/kg/hr (05/06/21 0505)  . heparin Stopped (05/06/21 4709)  . nitroGLYCERIN Stopped (05/06/21 0051)  Assessment/Plan:   1. Chest Pain:  Obese female with unRx HTN and HLD with strong family history of premature CAD. ECG with inferior T  Wave changes and troponin peak 182 For cath today   2. HTN:  ARB and beta blocker started improved  3. HLD  Started on lipitor noted "intolerance" in past with ? Diarrhea but sounds like she just didn't want to be on medication     Heather Pace 05/06/2021, 8:28 AM

## 2021-05-06 NOTE — Interval H&P Note (Signed)
Cath Lab Visit (complete for each Cath Lab visit)  Clinical Evaluation Leading to the Procedure:   ACS: Yes.    Non-ACS:    Anginal Classification: CCS Pace  Anti-ischemic medical therapy: Minimal Therapy (1 class of medications)  Non-Invasive Test Results: No non-invasive testing performed  Prior CABG: No previous CABG      History and Physical Interval Note:  05/06/2021 8:36 AM  Heather Pace  has presented today for surgery, with the diagnosis of chest pain.  The various methods of treatment have been discussed with the patient and family. After consideration of risks, benefits and other options for treatment, the patient has consented to  Procedure(s): LEFT HEART CATH AND CORONARY ANGIOGRAPHY (N/A) as a surgical intervention.  The patient's history has been reviewed, patient examined, no change in status, stable for surgery.  I have reviewed the patient's chart and labs.  Questions were answered to the patient's satisfaction.     Heather Pace

## 2021-05-07 DIAGNOSIS — E785 Hyperlipidemia, unspecified: Secondary | ICD-10-CM

## 2021-05-07 DIAGNOSIS — I10 Essential (primary) hypertension: Secondary | ICD-10-CM

## 2021-05-07 LAB — CBC
HCT: 43.7 % (ref 36.0–46.0)
Hemoglobin: 13.9 g/dL (ref 12.0–15.0)
MCH: 30.3 pg (ref 26.0–34.0)
MCHC: 31.8 g/dL (ref 30.0–36.0)
MCV: 95.2 fL (ref 80.0–100.0)
Platelets: 206 10*3/uL (ref 150–400)
RBC: 4.59 MIL/uL (ref 3.87–5.11)
RDW: 13.7 % (ref 11.5–15.5)
WBC: 7.6 10*3/uL (ref 4.0–10.5)
nRBC: 0 % (ref 0.0–0.2)

## 2021-05-07 LAB — BASIC METABOLIC PANEL
Anion gap: 8 (ref 5–15)
BUN: 11 mg/dL (ref 8–23)
CO2: 22 mmol/L (ref 22–32)
Calcium: 8.9 mg/dL (ref 8.9–10.3)
Chloride: 107 mmol/L (ref 98–111)
Creatinine, Ser: 0.9 mg/dL (ref 0.44–1.00)
GFR, Estimated: >60 mL/min (ref >60)
Glucose, Bld: 102 mg/dL — ABNORMAL HIGH (ref 70–99)
Potassium: 3.8 mmol/L (ref 3.5–5.1)
Sodium: 137 mmol/L (ref 135–145)

## 2021-05-07 MED ORDER — NITROGLYCERIN 0.4 MG SL SUBL: 0.4000 mg | SUBLINGUAL_TABLET | SUBLINGUAL | 0 refills | Status: AC | PRN

## 2021-05-07 MED ORDER — ASPIRIN 81 MG PO CHEW: 81.0000 mg | CHEWABLE_TABLET | Freq: Every day | ORAL | 2 refills | Status: AC

## 2021-05-07 MED ORDER — CLOPIDOGREL BISULFATE 75 MG PO TABS: 75.0000 mg | ORAL_TABLET | Freq: Every day | ORAL | 2 refills | Status: AC

## 2021-05-07 MED ORDER — ATORVASTATIN CALCIUM 80 MG PO TABS: 80.0000 mg | ORAL_TABLET | Freq: Every day | ORAL | 1 refills | Status: AC

## 2021-05-07 MED ORDER — METOPROLOL TARTRATE 25 MG PO TABS: 25.0000 mg | ORAL_TABLET | Freq: Two times a day (BID) | ORAL | 2 refills | Status: AC

## 2021-05-07 MED ORDER — LOSARTAN POTASSIUM 25 MG PO TABS: 25.0000 mg | ORAL_TABLET | Freq: Every day | ORAL | 2 refills | Status: DC

## 2021-05-07 MED FILL — Verapamil HCl IV Soln 2.5 MG/ML: INTRAVENOUS | Qty: 2 | Status: AC

## 2021-05-07 NOTE — Discharge Summary (Signed)
Discharge Summary    Patient ID: Heather Pace MRN: [id number]; DOB: 1961/06/05  Admit date: 05/05/2021 Discharge date: 05/07/2021  PCP:  [name], MD   [place] Providers Cardiologist:  Dr. 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  Successful overlapping stent from the proximal to the mid RCA reducing 99% stenosis to 0%. The proximal overlapping stent was required because of an edge dissection that occurred after deployment of the initial stent. Initial stent, 3.5 x 16 Synergy, overlapped with a 3.0 x 8 Synergy. The more proximal stent used to overlap was postdilated to 3.25 mm in diameter. Clinical suspicion is that initial dissection was secondary to oversizing with a 3.5 mm Synergy.  Left coronary could not be selectively engaged. Nonselective images were obtained and there is no left main disease, no significant proximal mid or distal LAD disease, widely patent proximal to mid circumflex with irregularities and a distal obtuse marginal and also the bifurcation of the marginal.   Normal LV function.  Consider femoral cath if angiography needed in the future especially if there is suspicion that the left coronary may be the culprit.  RECOMMENDATIONS:   Aspirin and Plavix for 12 months.  Patient has a plethora of untreated cardiovascular risk factors. 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 LDL should be targeted to less than 70. Would quickly progress to PCSK9 therapy as she has a prior history of statin intolerance. The blood pressure is severely elevated and needs  aggressive management to 130/80 mmHg range.  Eligible for discharge in a.m.  Upon leaving the Cath Lab the patient was still having residual mild chest discomfort that was slowly resolving. Suspicion is that there was microembolization.  Diagnostic Dominance: Right    Intervention        Echocardiogram 05/06/21:  1. Left ventricular ejection fraction, by estimation, is 60 to 65%. The  left ventricle has normal function. The left ventricle has no regional  wall motion abnormalities. Left ventricular diastolic parameters are  consistent with Grade I diastolic  dysfunction (impaired relaxation).  2. Right ventricular systolic function is normal. The right ventricular  size is normal.  3. The mitral valve is normal in structure. Trivial mitral valve  regurgitation. No evidence of mitral stenosis.  4. The aortic valve is tricuspid. Aortic valve regurgitation is not  visualized. No aortic stenosis is present.  5. The inferior vena cava is normal in size with greater than 50%  respiratory variability, suggesting right atrial pressure of 3 mmHg.   History of Present Illness    Heather Pace is a 60 y.o. female with a hx HLD who is being seen 5/10/2022for the evaluation of chest pain.  Pt reports that she was in her usual state of health until last Saturday when she developed centralized chest tightness while doing yard work. The tightness was a 7-8/10 in severity with no associated symptoms. Symptoms lasted <10 minutes in duration. She reports that she took it easy the rest of the day. Unfortunately her symptoms returned after attempting to lay down to go to sleep later that evening. She felt better when she woke  up on Sunday morning but her symptoms again returned with ambulation around her home. She took the trashcan out Monday night and had to walk up a hill which initiated the worst pain yet. Given her ongoing symptoms, she proceeded to an UC center who then sent her  to the ED for further evaluation.   Hospital Course    On ED presentation, EKG with no acute STEMI however with subtle ST depressions and mild elevation in aVR.  HsT found to be 130>>150>>173>>182 consistent with ACS/NSTEMI. She was started on IV Heparin with plans for [facility]. This was performed 05/06/21 which showed a 99% obstruction of the mRCA thrombotic occlusion, now with successful overlapping stent from the proximal to the mid RCA reducing 99% stenosis to 0%.  The proximal overlapping stent was required due to edge dissection that occurred after deployment of the initial stent. Left coronary could not be selectively engaged.  Nonselective images were obtained and there is no left main disease, no significant proximal mid or distal LAD disease, widely patent proximal to mid circumflex with irregularities and a distal obtuse marginal and also the bifurcation of the marginal. Consider femoral cath if angiography needed in the future especially if there is suspicion that the left coronary may be the culprit.  Recommendations are for uninterrupted DAPT with ASA and Plavix for at least 1 year along with aggressive risk factor modification. LDL was found to be markedly elevated at 213 with a goal LDL <70. She was placed on high intensity atorvastatin with the intention to refer to [facility] for possible PCSK9 inhibitor. Statins are listed as an allergy however she is intolerant and long term plan will be for PCSK9. BP was noted to be elevated during her hospital course as well. She has been treated with metoprolol 25mg  BID along with losartan 25. BP's are adequate this AM for discharge with plently of room for OP titration. Post cath creatinine stable at 0.90. All other labs stable with no acute abnormalities. Radial site is stable with no sign of bleeding or hematoma. This morning, the patient is ambulating in her room without difficulty. She has been seen and walked with cardiac rehab without anginal  symptoms.   Consultants: None   The patient was seen and examined by Dr. Johnsie Cancel who feels that she is stable and ready for discharge today, 05/07/21. She has ambulated with cardiac rehab without difficulty. She has follow up made and her Rx have been to her pharmacy.   Did the patient have an acute coronary syndrome (MI, NSTEMI, STEMI, etc) this admission?:  Yes                               AHA/ACC Clinical Performance & Quality Measures: 1. Aspirin prescribed? - Yes 2. ADP Receptor Inhibitor (Plavix/Clopidogrel, Brilinta/Ticagrelor or Effient/Prasugrel) prescribed (includes medically managed patients)? - Yes 3. Beta Blocker prescribed? - Yes 4. High Intensity Statin (Lipitor 40-80mg  or Crestor 20-40mg ) prescribed? - Yes 5. EF assessed during THIS hospitalization? - Yes 6. For EF <40%, was ACEI/ARB prescribed? - Not Applicable (EF >/= 73%) 7. For EF <40%, Aldosterone Antagonist (Spironolactone or Eplerenone) prescribed? - Not Applicable (EF >/= 71%) 8. Cardiac Rehab Phase II ordered (including medically managed patients)? - Yes  ___________  Discharge Vitals Blood pressure 130/89, pulse 68, temperature 97.9 F (36.6 C), temperature source Oral, resp. rate 18, height 5\' 9"  (1.753 m), weight 102.6 kg, SpO2 95 %.  Filed Weights   05/05/21 2008 05/06/21 0425 05/07/21 0431  Weight: 102.3 kg 102.6 kg 102.6 kg   Labs & Radiologic Studies    CBC Recent Labs    05/06/21 0251 05/07/21 0239  WBC 8.6 7.6  HGB 15.3* 13.9  HCT 46.2* 43.7  MCV 92.0 95.2  PLT 242 [id number]   Basic Metabolic Panel Recent Labs    05/06/21 0251 05/07/21 0239  NA 136 137  K 3.5 3.8  CL 103 107  CO2 23 22  GLUCOSE 121* 102*  BUN 11 11  CREATININE 0.96 0.90  CALCIUM 8.9 8.9   Liver Function Tests Recent Labs    05/05/21 1812  AST 31  ALT 38  ALKPHOS 81  BILITOT 1.0  PROT 7.4  ALBUMIN 4.3   No results for input(s): LIPASE, AMYLASE in the last 72 hours. High Sensitivity Troponin:   Recent Labs   Lab 05/05/21 1406 05/05/21 1551 05/05/21 1812 05/05/21 2020  TROPONINIHS 130* 150* 173* 182*    BNP Invalid input(s): POCBNP D-Dimer No results for input(s): DDIMER in the last 72 hours. Hemoglobin A1C Recent Labs    05/05/21 1812  HGBA1C 6.3*   Fasting Lipid Panel Recent Labs    05/06/21 0251  CHOL 301*  HDL 52  LDLCALC 213*  TRIG 178*  CHOLHDL 5.8   Thyroid Function Tests Recent Labs    05/05/21 1812  TSH 1.932   _____________  DG Chest 2 View  Result Date: 05/05/2021 CLINICAL DATA:  Chest pain EXAM: CHEST - 2 VIEW COMPARISON:  None. FINDINGS: Lungs are clear. Heart size and pulmonary vascularity are normal. No adenopathy. No pneumothorax. There is mild midthoracic levoscoliosis. IMPRESSION: Lungs clear.  Cardiac silhouette normal. Electronically Signed   By: Lowella Grip III M.D.   On: 05/05/2021 15:35   CARDIAC CATHETERIZATION  Result Date: 05/06/2021  Acute coronary syndrome due to 99% obstruction of the mid right coronary, thrombotic.  Successful overlapping stent from the proximal to the mid RCA reducing 99% stenosis to 0%.  The proximal overlapping stent was required because of an edge dissection that occurred after deployment of the initial stent.  Initial stent, 3.5 x 16 Synergy, overlapped with a 3.0 x 8 Synergy.  The more proximal stent used to overlap was postdilated to 3.25 mm in diameter.  Clinical suspicion is that initial dissection was secondary to oversizing with a 3.5 mm Synergy.  Left coronary could not be selectively engaged.  Nonselective images were obtained and there is no left main disease, no significant proximal mid or distal LAD disease, widely patent proximal to mid circumflex with irregularities and a distal obtuse marginal and also the bifurcation of the marginal.  Normal LV function.  Consider femoral cath if angiography needed in the future especially if there is suspicion that the left coronary may be the culprit. RECOMMENDATIONS:   Aspirin and Plavix for 12 months.  Patient has a plethora of untreated cardiovascular risk factors.  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  LDL should be targeted to less than 70.  Would quickly progress to PCSK9 therapy as she has a prior history of statin intolerance.  The blood pressure is severely elevated and needs aggressive management to 130/80 mmHg range.  Eligible for discharge in a.m.  Upon leaving the Cath Lab the patient was still having residual mild chest discomfort that was slowly resolving.  Suspicion is that there was microembolization.  ECHOCARDIOGRAM COMPLETE  Result Date: 05/06/2021  ECHOCARDIOGRAM REPORT   Patient Name:   SORAYA SANTELLANO Date of Exam: 05/06/2021 Medical Rec #:  [id number]       Height:       69.0 in Accession #:    [id number]      Weight:       226.3 lb Date of Birth:  12-25-1961       BSA:          2.177 m Patient Age:    38 years        BP:           128/70 mmHg Patient Gender: F               HR:           76 bpm. Exam Location:  Inpatient Procedure: 2D Echo, Cardiac Doppler and Color Doppler Indications:    R07.9* Chest pain, unspecified  History:        Patient has no prior history of Echocardiogram examinations.                 Risk Factors:Dyslipidemia. Cancer.  Sonographer:    Jonelle Sidle Dance Referring Phys: [address] [place]  1. Left ventricular ejection fraction, by estimation, is 60 to 65%. The left ventricle has normal function. The left ventricle has no regional wall motion abnormalities. Left ventricular diastolic parameters are consistent with Grade I diastolic dysfunction (impaired relaxation).  2. Right ventricular systolic function is normal. The right ventricular size is normal.  3. The mitral valve is normal in structure. Trivial mitral valve regurgitation. No evidence of mitral stenosis.  4. The aortic valve is tricuspid. Aortic valve regurgitation is not visualized. No aortic stenosis is  present.  5. The inferior vena cava is normal in size with greater than 50% respiratory variability, suggesting right atrial pressure of 3 mmHg. FINDINGS  Left Ventricle: Left ventricular ejection fraction, by estimation, is 60 to 65%. The left ventricle has normal function. The left ventricle has no regional wall motion abnormalities. The left ventricular internal cavity size was normal in size. There is  no left ventricular hypertrophy. Left ventricular diastolic parameters are consistent with Grade I diastolic dysfunction (impaired relaxation). Right Ventricle: The right ventricular size is normal. Right ventricular systolic function is normal. Left Atrium: Left atrial size was normal in size. Right Atrium: Right atrial size was normal in size. Pericardium: There is no evidence of pericardial effusion. Mitral Valve: The mitral valve is normal in structure. Trivial mitral valve regurgitation. No evidence of mitral valve stenosis. Tricuspid Valve: The tricuspid valve is normal in structure. Tricuspid valve regurgitation is trivial. No evidence of tricuspid stenosis. Aortic Valve: The aortic valve is tricuspid. Aortic valve regurgitation is not visualized. No aortic stenosis is present. Pulmonic Valve: The pulmonic valve was normal in structure. Pulmonic valve regurgitation is not visualized. No evidence of pulmonic stenosis. Aorta: The aortic root is normal in size and structure. Venous: The inferior vena cava is normal in size with greater than 50% respiratory variability, suggesting right atrial pressure of 3 mmHg. IAS/Shunts: No atrial level shunt detected by color flow Doppler.  LEFT VENTRICLE PLAX 2D LVIDd:         4.60 cm  Diastology LVIDs:         3.10 cm  LV e' medial:    6.01 cm/s LV PW:         1.00 cm  LV E/e' medial:  8.5 LV IVS:  1.00 cm  LV e' lateral:   9.11 cm/s LVOT diam:     1.90 cm  LV E/e' lateral: 5.6 LV SV:         43 LV SV Index:   20 LVOT Area:     2.84 cm  RIGHT VENTRICLE              IVC RV Basal diam:  2.80 cm     IVC diam: 1.50 cm RV S prime:     11.60 cm/s TAPSE (M-mode): 2.5 cm LEFT ATRIUM             Index       RIGHT ATRIUM           Index LA diam:        3.90 cm 1.79 cm/m  RA Area:     14.70 cm LA Vol (A2C):   47.0 ml 21.59 ml/m RA Volume:   38.00 ml  17.45 ml/m LA Vol (A4C):   29.6 ml 13.59 ml/m LA Biplane Vol: 38.6 ml 17.73 ml/m  AORTIC VALVE LVOT Vmax:   75.40 cm/s LVOT Vmean:  51.400 cm/s LVOT VTI:    0.150 m  AORTA Ao Root diam: 2.70 cm Ao Asc diam:  2.80 cm MITRAL VALVE MV Area (PHT): 2.22 cm    SHUNTS MV Decel Time: 342 msec    Systemic VTI:  0.15 m MV E velocity: 50.90 cm/s  Systemic Diam: 1.90 cm MV A velocity: 68.90 cm/s MV E/A ratio:  0.74 Kirk Ruths MD Electronically signed by Kirk Ruths MD Signature Date/Time: 05/06/2021/4:16:55 PM    Final    Disposition   Pt is being discharged home today in good condition.  Follow-up Plans & Appointments    Follow-up Information    Isaiah Serge, NP Follow up on 05/15/2021.   Specialties: Cardiology, Radiology Why: at 9:15am  Contact information: [place] [address] [phone]              Discharge Instructions    Amb Referral to Cardiac Rehabilitation   Complete by: As directed    Diagnosis:  Coronary Stents NSTEMI     After initial evaluation and assessments completed: Virtual Based Care may be provided alone or in conjunction with Phase 2 Cardiac Rehab based on patient barriers.: Yes   Call MD for:  difficulty breathing, headache or visual disturbances   Complete by: As directed    Call MD for:  extreme fatigue   Complete by: As directed    Call MD for:  hives   Complete by: As directed    Call MD for:  persistant dizziness or light-headedness   Complete by: As directed    Call MD for:  persistant nausea and vomiting   Complete by: As directed    Call MD for:  redness, tenderness, or signs of infection (pain, swelling, redness, odor or green/yellow  discharge around incision site)   Complete by: As directed    Call MD for:  severe uncontrolled pain   Complete by: As directed    Call MD for:  temperature >100.4   Complete by: As directed    Diet - low sodium heart healthy   Complete by: As directed    Discharge instructions   Complete by: As directed    PLEASE DO NOT MISS ANY DOSES OF YOUR PLAVIX!!!! Also keep a log of you blood pressures and bring back to your follow up appt. Please call the  office with any questions.   Patients taking blood thinners should generally stay away from medicines like ibuprofen, Advil, Motrin, naproxen, and Aleve due to risk of stomach bleeding. You may take Tylenol as directed or talk to your primary doctor about alternatives.  PLEASE ENSURE THAT YOU DO NOT RUN OUT OF YOUR PLAVIX. IF you have issues obtaining this medication due to cost please CALL the office 3-5 business days prior to running out in order to prevent missing doses of this medication.   No driving for 3-5 days. No lifting over 5 lbs for 1 week. No sexual activity for 1 week. Keep procedure site clean & dry. If you notice increased pain, swelling, bleeding or pus, call/return!  You may shower, but no soaking baths/hot tubs/pools for 1 week.   Increase activity slowly   Complete by: As directed      Discharge Medications   Allergies as of 05/07/2021      Reactions   Atorvastatin    Bupropion    Rosuvastatin       Medication List    TAKE these medications   aspirin 81 MG chewable tablet Chew 1 tablet (81 mg total) by mouth daily. Start taking on: May 08, 2021   atorvastatin 80 MG tablet Commonly known as: LIPITOR Take 1 tablet (80 mg total) by mouth daily. Start taking on: May 08, 2021   clopidogrel 75 MG tablet Commonly known as: PLAVIX Take 1 tablet (75 mg total) by mouth daily with breakfast. Start taking on: May 08, 2021   losartan 25 MG tablet Commonly known as: COZAAR Take 1 tablet (25 mg total) by mouth  daily. Start taking on: May 08, 2021   metoprolol tartrate 25 MG tablet Commonly known as: LOPRESSOR Take 1 tablet (25 mg total) by mouth 2 (two) times daily.   nitroGLYCERIN 0.4 MG SL tablet Commonly known as: NITROSTAT Place 1 tablet (0.4 mg total) under the tongue every 5 (five) minutes x 3 doses as needed for chest pain.       Outstanding Labs/Studies   None   Duration of Discharge Encounter   Greater than 30 minutes including physician time.  Signed, Kathyrn Drown, NP 05/07/2021, 9:58 AM

## 2021-05-07 NOTE — Progress Notes (Signed)
Progress Note  Patient Name: Heather Pace Date of Encounter: 05/07/2021  Primary Cardiologist: New to [facility] this AM. No recurrent chest pain.   Inpatient Medications    Scheduled Meds: . aspirin  81 mg Oral Daily  . atorvastatin  80 mg Oral Daily  . clopidogrel  75 mg Oral Q breakfast  . enoxaparin (LOVENOX) injection  40 mg Subcutaneous Q24H  . losartan  25 mg Oral Daily  . metoprolol tartrate  25 mg Oral BID  . sodium chloride flush  3 mL Intravenous Q12H   Continuous Infusions: . sodium chloride    . nitroGLYCERIN Stopped (05/07/21 0100)   PRN Meds: sodium chloride, acetaminophen, ALPRAZolam, nitroGLYCERIN, ondansetron (ZOFRAN) IV, oxyCODONE, sodium chloride flush   Vital Signs    Vitals:   05/06/21 1554 05/06/21 2105 05/07/21 0430 05/07/21 0431  BP: 113/75 (!) 143/80 130/89   Pulse: 70 75 68   Resp: 18  18   Temp: 98 F (36.7 C) 97.9 F (36.6 C) 97.9 F (36.6 C)   TempSrc: Oral Oral Oral   SpO2: 99% 91% 95%   Weight:    102.6 kg  Height:        Intake/Output Summary (Last 24 hours) at 05/07/2021 0650 Last data filed at 05/07/2021 0000 Gross per 24 hour  Intake 1077.92 ml  Output 200 ml  Net 877.92 ml   Filed Weights   05/05/21 2008 05/06/21 0425 05/07/21 0431  Weight: 102.3 kg 102.6 kg 102.6 kg    Physical Exam   General: Well developed, well nourished, NAD Neck: Negative for carotid bruits. No JVD Lungs:Clear to ausculation bilaterally. Breathing is unlabored. Cardiovascular: RRR with S1 S2. No murmurs Abdomen: Soft, non-tender, non-distended. No obvious abdominal masses. Extremities: No edema. Radial pulses 2+ bilaterally Neuro: Alert and oriented. No focal deficits. No facial asymmetry. MAE spontaneously. Psych: Responds to questions appropriately with normal affect.    Labs    Chemistry Recent Labs  Lab 05/05/21 1406 05/05/21 1812 05/06/21 0251 05/07/21 0239  NA 136  --  136 137  K 3.8  --  3.5  3.8  CL 104  --  103 107  CO2 24  --  23 22  GLUCOSE 113*  --  121* 102*  BUN 11  --  11 11  CREATININE 0.99  --  0.96 0.90  CALCIUM 9.4  --  8.9 8.9  PROT  --  7.4  --   --   ALBUMIN  --  4.3  --   --   AST  --  31  --   --   ALT  --  38  --   --   ALKPHOS  --  81  --   --   BILITOT  --  1.0  --   --   GFRNONAA >60  --  >60 >60  ANIONGAP 8  --  10 8     Hematology Recent Labs  Lab 05/05/21 1406 05/06/21 0251 05/07/21 0239  WBC 7.5 8.6 7.6  RBC 5.49* 5.02 4.59  HGB 16.7* 15.3* 13.9  HCT 50.9* 46.2* 43.7  MCV 92.7 92.0 95.2  MCH 30.4 30.5 30.3  MCHC 32.8 33.1 31.8  RDW 13.2 13.6 13.7  PLT 273 242 206    Cardiac EnzymesNo results for input(s): TROPONINI in the last 168 hours. No results for input(s): TROPIPOC in the last 168 hours.   BNPNo results for input(s): BNP, PROBNP in the  last 168 hours.   DDimer No results for input(s): DDIMER in the last 168 hours.   Radiology    DG Chest 2 View  Result Date: 05/05/2021 CLINICAL DATA:  Chest pain EXAM: CHEST - 2 VIEW COMPARISON:  None. FINDINGS: Lungs are clear. Heart size and pulmonary vascularity are normal. No adenopathy. No pneumothorax. There is mild midthoracic levoscoliosis. IMPRESSION: Lungs clear.  Cardiac silhouette normal. Electronically Signed   By: Lowella Grip III M.D.   On: 05/05/2021 15:35   CARDIAC CATHETERIZATION  Result Date: 05/06/2021  Acute coronary syndrome due to 99% obstruction of the mid right coronary, thrombotic.  Successful overlapping stent from the proximal to the mid RCA reducing 99% stenosis to 0%.  The proximal overlapping stent was required because of an edge dissection that occurred after deployment of the initial stent.  Initial stent, 3.5 x 16 Synergy, overlapped with a 3.0 x 8 Synergy.  The more proximal stent used to overlap was postdilated to 3.25 mm in diameter.  Clinical suspicion is that initial dissection was secondary to oversizing with a 3.5 mm Synergy.  Left coronary could  not be selectively engaged.  Nonselective images were obtained and there is no left main disease, no significant proximal mid or distal LAD disease, widely patent proximal to mid circumflex with irregularities and a distal obtuse marginal and also the bifurcation of the marginal.  Normal LV function.  Consider femoral cath if angiography needed in the future especially if there is suspicion that the left coronary may be the culprit. RECOMMENDATIONS:  Aspirin and Plavix for 12 months.  Patient has a plethora of untreated cardiovascular risk factors.  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  LDL should be targeted to less than 70.  Would quickly progress to PCSK9 therapy as she has a prior history of statin intolerance.  The blood pressure is severely elevated and needs aggressive management to 130/80 mmHg range.  Eligible for discharge in a.m.  Upon leaving the Cath Lab the patient was still having residual mild chest discomfort that was slowly resolving.  Suspicion is that there was microembolization.  ECHOCARDIOGRAM COMPLETE  Result Date: 05/06/2021    ECHOCARDIOGRAM REPORT   Patient Name:   Heather Pace Date of Exam: 05/06/2021 Medical Rec #:  [id number]       Height:       69.0 in Accession #:    [id number]      Weight:       226.3 lb Date of Birth:  1961/04/10       BSA:          2.177 m Patient Age:    60 years        BP:           128/70 mmHg Patient Gender: F               HR:           76 bpm. Exam Location:  Inpatient Procedure: 2D Echo, Cardiac Doppler and Color Doppler Indications:    R07.9* Chest pain, unspecified  History:        Patient has no prior history of Echocardiogram examinations.                 Risk Factors:Dyslipidemia. Cancer.  Sonographer:    Jonelle Sidle Dance Referring Phys: 33 [place]  1. Left ventricular ejection fraction, by estimation, is 60 to 65%. The left ventricle has normal function.  The left ventricle has  no regional wall motion abnormalities. Left ventricular diastolic parameters are consistent with Grade I diastolic dysfunction (impaired relaxation).  2. Right ventricular systolic function is normal. The right ventricular size is normal.  3. The mitral valve is normal in structure. Trivial mitral valve regurgitation. No evidence of mitral stenosis.  4. The aortic valve is tricuspid. Aortic valve regurgitation is not visualized. No aortic stenosis is present.  5. The inferior vena cava is normal in size with greater than 50% respiratory variability, suggesting right atrial pressure of 3 mmHg. FINDINGS  Left Ventricle: Left ventricular ejection fraction, by estimation, is 60 to 65%. The left ventricle has normal function. The left ventricle has no regional wall motion abnormalities. The left ventricular internal cavity size was normal in size. There is  no left ventricular hypertrophy. Left ventricular diastolic parameters are consistent with Grade I diastolic dysfunction (impaired relaxation). Right Ventricle: The right ventricular size is normal. Right ventricular systolic function is normal. Left Atrium: Left atrial size was normal in size. Right Atrium: Right atrial size was normal in size. Pericardium: There is no evidence of pericardial effusion. Mitral Valve: The mitral valve is normal in structure. Trivial mitral valve regurgitation. No evidence of mitral valve stenosis. Tricuspid Valve: The tricuspid valve is normal in structure. Tricuspid valve regurgitation is trivial. No evidence of tricuspid stenosis. Aortic Valve: The aortic valve is tricuspid. Aortic valve regurgitation is not visualized. No aortic stenosis is present. Pulmonic Valve: The pulmonic valve was normal in structure. Pulmonic valve regurgitation is not visualized. No evidence of pulmonic stenosis. Aorta: The aortic root is normal in size and structure. Venous: The inferior vena cava is normal in size with greater than 50% respiratory  variability, suggesting right atrial pressure of 3 mmHg. IAS/Shunts: No atrial level shunt detected by color flow Doppler.  LEFT VENTRICLE PLAX 2D LVIDd:         4.60 cm  Diastology LVIDs:         3.10 cm  LV e' medial:    6.01 cm/s LV PW:         1.00 cm  LV E/e' medial:  8.5 LV IVS:        1.00 cm  LV e' lateral:   9.11 cm/s LVOT diam:     1.90 cm  LV E/e' lateral: 5.6 LV SV:         43 LV SV Index:   20 LVOT Area:     2.84 cm  RIGHT VENTRICLE             IVC RV Basal diam:  2.80 cm     IVC diam: 1.50 cm RV S prime:     11.60 cm/s TAPSE (M-mode): 2.5 cm LEFT ATRIUM             Index       RIGHT ATRIUM           Index LA diam:        3.90 cm 1.79 cm/m  RA Area:     14.70 cm LA Vol (A2C):   47.0 ml 21.59 ml/m RA Volume:   38.00 ml  17.45 ml/m LA Vol (A4C):   29.6 ml 13.59 ml/m LA Biplane Vol: 38.6 ml 17.73 ml/m  AORTIC VALVE LVOT Vmax:   75.40 cm/s LVOT Vmean:  51.400 cm/s LVOT VTI:    0.150 m  AORTA Ao Root diam: 2.70 cm Ao Asc diam:  2.80 cm MITRAL VALVE MV Area (PHT): 2.22  cm    SHUNTS MV Decel Time: 342 msec    Systemic VTI:  0.15 m MV E velocity: 50.90 cm/s  Systemic Diam: 1.90 cm MV A velocity: 68.90 cm/s MV E/A ratio:  0.74 Kirk Ruths MD Electronically signed by Kirk Ruths MD Signature Date/Time: 05/06/2021/4:16:55 PM    Final    Telemetry    5.12.22 NSR with HR in the 60-80s- Personally Reviewed  ECG    No new tracing as of 5.12.22- Personally Reviewed  Cardiac Studies   LHC 05/06/21:   Acute coronary syndrome due to 99% obstruction of the mid right coronary, thrombotic.  Successful overlapping stent from the proximal to the mid RCA reducing 99% stenosis to 0%.  The proximal overlapping stent was required because of an edge dissection that occurred after deployment of the initial stent.  Initial stent, 3.5 x 16 Synergy, overlapped with a 3.0 x 8 Synergy.  The more proximal stent used to overlap was postdilated to 3.25 mm in diameter.  Clinical suspicion is that initial  dissection was secondary to oversizing with a 3.5 mm Synergy.  Left coronary could not be selectively engaged.  Nonselective images were obtained and there is no left main disease, no significant proximal mid or distal LAD disease, widely patent proximal to mid circumflex with irregularities and a distal obtuse marginal and also the bifurcation of the marginal.   Normal LV function.  Consider femoral cath if angiography needed in the future especially if there is suspicion that the left coronary may be the culprit.  RECOMMENDATIONS:   Aspirin and Plavix for 12 months.  Patient has a plethora of untreated cardiovascular risk factors.  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  LDL should be targeted to less than 70.  Would quickly progress to PCSK9 therapy as she has a prior history of statin intolerance.  The blood pressure is severely elevated and needs aggressive management to 130/80 mmHg range.  Eligible for discharge in a.m.  Upon leaving the Cath Lab the patient was still having residual mild chest discomfort that was slowly resolving.  Suspicion is that there was microembolization.  Diagnostic Dominance: Right    Intervention     Patient Profile     60 y.o. female with hx HLD who is being seen 05/05/2021 for the evaluation of chest pain.   Assessment & Plan    1. NSTEMI: -Cardiac catheterization performed 05/06/2021 with high-grade mid RCA lesion at 99% with overlapping stents to the mid to proximal RCA.  Second stent placed due to edge dissection with transient occlusion of the large right ventricular branch opened with IC nitroglycerin.  Left coronary never selectively engaged with normal left main.  Per cath note, if angiography required in the future with concern about left coronary system, will recommend femoral LHC. -IV nitroglycerin to be weaned off -Continue ASA, Plavix, high intensity atorvastatin, metoprolol -Post cath  creatinine stable at 0.90 -Echocardiogram EF at 60 to 65% with no regional wall motion abnormalities and DD  2.  HTN: -Stable, 130/89, 143/80, 113/75 -Continue metoprolol, losartan  3.  Familial hyperlipidemia/uncontrolled HLD: -LDL found to be markedly elevated at 213 -Patient placed on high intensity atorvastatin -We will need referral to lipid clinic for further lipid lowering agents including PCSK9 inhibitor  4.  Diastolic dysfunction: -Per echocardiogram 05/06/2021 with LVEF at 60 to 65% with no regional wall motion abnormalities, G1 DD and no valvular disease -Appears euvolemic today  Signed, Kathyrn Drown NP-C [facility]  Pager: [phone] 05/07/2021, 6:50 AM     For questions or updates, please contact   Please consult [email or web address] for contact info under Cardiology/STEMI.

## 2021-05-07 NOTE — Progress Notes (Signed)
CARDIAC REHAB PHASE I   PRE:  Rate/Rhythm: 69 SR  BP:  Supine: 151/84  Sitting:   Standing:    SaO2: 98%RA  MODE:  Ambulation: 470 ft   POST:  Rate/Rhythm: 87 SR  BP:  Supine: 173/104, 142/101, 155/88  Sitting:   Standing:    SaO2: 97%RA 0830-0932 Pt walked 470 ft on RA with steady gait and no c/o CP. Tolerated well. Education completed with pt and roommate who voiced understanding. Stressed importance of plavix with stent. Reviewed NTG use, walking for ex, watching carbs with A1C of 6.3, heart healthy food choices, and CRP 2. Referral to [facility] placed. Encouraged pt to attend.   Graylon Good, RN BSN  05/07/2021 9:28 AM

## 2021-05-12 ENCOUNTER — Telehealth (HOSPITAL_COMMUNITY): Payer: Self-pay

## 2021-05-12 NOTE — Telephone Encounter (Signed)
Pt insurance is active and benefits verified through [employer] Co-pay 0, DED $1,000/0 met, out of pocket $0/0 met, co-insurance 80%. no pre-authorization required. [facility] 05/12/2021_0 :46am, REF# [id number] (Out of Network)  Will contact patient to see if she is interested in the Cardiac Rehab Program. If interested, patient will need to complete follow up appt. Once completed, patient will be contacted for scheduling upon review by the RN Navigator.

## 2021-05-12 NOTE — Telephone Encounter (Signed)
Pt called back and stated that because of her insurance being out of network with [place] and being in network with only [facility] facilities she would like to go to [facility] cardiac rehab because they are with [facility]. Will fax pt referral to them and close her referral.

## 2021-05-14 NOTE — Progress Notes (Signed)
Cardiology Office Note   Date:  05/15/2021   ID:  Heather Pace, DOB 1961-08-29, MRN [id number]  PCP:  [name], MD  Cardiologist:  Dr. Johnsie Cancel    Chief Complaint  Patient presents with  . Hospitalization Follow-up    NSTEMI with PCI      History of Present Illness: Heather Pace is a 60 y.o. female who presents for post hospital NSTEMI.  Pt with central chest tightness with yard work.  Hs tropoin pk 182, NSTEMI and had cardiac cath with 99% obstruction of mRCA, PCI to to 0% stenosis.    The proximal overlapping stent was required due to edge dissection that occurred after deployment of the initial stent. Left coronary could not be selectively engaged. Nonselective images were obtained and there is no left main disease, no significant proximal mid or distal LAD disease, widely patent proximal to mid circumflex with irregularities and a distal obtuse marginal and also the bifurcation of the marginal. Consider femoral cath if angiography needed in the future especially if there is suspicion that the left coronary may be the culprit.  DAPT for at least 1 year.  On 80 of lipitor,  Will need follow up, lipid if still uncontrolled possible PCSK9.  On Echo Ef 60-65%  G1DD  Discharged 05/07/21  Today is doing well. No chest pain, no SOB is up to walking 8 min per day.  She is eating much healthier, no problems with the ASA and plavix.  Reviewed need for ASA and plavix, reviewed picture of cath results.   Questions answered.  She is planning to see Dr. Atilano Median with cards in [place].  That appt will be 06/10/21.     BPs at home range 118/69  To 142/83 occ 110/70 but average 491P systolic.  She noted mild dizziness this AM briefly.    Past Medical History:  Diagnosis Date  . Coronary artery disease   . Hyperlipidemia    not on meds  . NSTEMI (non-ST elevated myocardial infarction) ([facility]) 04/2021  . Rosacea   . S/P angioplasty with stent 04/2021  . Seborrheic keratoses   . Skin  cancer     Past Surgical History:  Procedure Laterality Date  . BACK SURGERY  2002   Hemilaminotomy right L5-S1 with disk excision and S1 nerve root  . BREAST REDUCTION SURGERY    . COLONOSCOPY  2019  . CORONARY STENT INTERVENTION N/A 05/06/2021   Procedure: CORONARY STENT INTERVENTION;  Surgeon: Belva Crome, MD;  Location: [facility];  Service: Cardiovascular;  Laterality: N/A;  . LEFT HEART CATH AND CORONARY ANGIOGRAPHY N/A 05/06/2021   Procedure: LEFT HEART CATH AND CORONARY ANGIOGRAPHY;  Surgeon: Belva Crome, MD;  Location: [facility];  Service: Cardiovascular;  Laterality: N/A;  . LESION REMOVAL Bilateral 01/18/2018   skin lesion X5  . PLANTAR FASCIECTOMY Bilateral      Current Outpatient Medications  Medication Sig Dispense Refill  . aspirin 81 MG chewable tablet Chew 1 tablet (81 mg total) by mouth daily. 90 tablet 2  . atorvastatin (LIPITOR) 80 MG tablet Take 1 tablet (80 mg total) by mouth daily. 60 tablet 1  . clopidogrel (PLAVIX) 75 MG tablet Take 1 tablet (75 mg total) by mouth daily with breakfast. 90 tablet 2  . losartan (COZAAR) 25 MG tablet Take 1 tablet (25 mg total) by mouth daily. 90 tablet 2  . metoprolol tartrate (LOPRESSOR) 25 MG tablet Take 1 tablet (25 mg total)  by mouth 2 (two) times daily. 120 tablet 2  . nitroGLYCERIN (NITROSTAT) 0.4 MG SL tablet Place 1 tablet (0.4 mg total) under the tongue every 5 (five) minutes x 3 doses as needed for chest pain. 25 tablet 0   Current Facility-Administered Medications  Medication Dose Route Frequency Provider Last Rate Last Admin  . 0.9 %  sodium chloride infusion  500 mL Intravenous Continuous Milus Banister, MD        Allergies:   Atorvastatin, Bupropion, and Rosuvastatin    Social History:  The patient  reports that she has never smoked. She has never used smokeless tobacco. She reports that she does not drink alcohol and does not use drugs.   Family History:  The patient's family history  includes Breast cancer (age of onset: 33) in her mother; Heart attack (age of onset: 21) in her father; Heart disease in her brother.    ROS:  General:no colds or fevers, no weight changes Skin:no rashes or ulcers HEENT:no blurred vision, no congestion CV:see HPI PUL:see HPI GI:no diarrhea constipation or melena, no indigestion GU:no hematuria, no dysuria MS:no joint pain, no claudication Neuro:no syncope, no lightheadedness Endo:no diabetes  A1C 6.3, no thyroid disease  Wt Readings from Last 3 Encounters:  05/15/21 226 lb 12.8 oz (102.9 kg)  05/07/21 226 lb 3.1 oz (102.6 kg)  01/20/18 219 lb (99.3 kg)     PHYSICAL EXAM: VS:  BP 122/60   Pulse 69   Ht 5' 9"  (1.753 m)   Wt 226 lb 12.8 oz (102.9 kg)   SpO2 96%   BMI 33.49 kg/m  , BMI Body mass index is 33.49 kg/m. General:Pleasant affect, NAD Skin:Warm and dry, brisk capillary refill HEENT:normocephalic, sclera clear, mucus membranes moist Neck:supple, no JVD, no bruits  Heart:S1S2 RRR without murmur, gallup, rub or click Lungs:clear without rales, rhonchi, or wheezes PTW:SFKC, non tender, + BS, do not palpate liver spleen or masses Ext:no lower ext edema, 2+ pedal pulses, 2+ radial pulses Neuro:alert and oriented X 3, MAE, follows commands, + facial symmetry    EKG:  EKG is ordered today. The ekg ordered today demonstrates SR T wave inversion III,AVF, no changes from lst one in hospital.     Recent Labs: 05/05/2021: ALT 38; TSH 1.932 05/07/2021: BUN 11; Creatinine, Ser 0.90; Hemoglobin 13.9; Platelets 206; Potassium 3.8; Sodium 137    Lipid Panel    Component Value Date/Time   CHOL 301 (H) 05/06/2021 0251   TRIG 178 (H) 05/06/2021 0251   HDL 52 05/06/2021 0251   CHOLHDL 5.8 05/06/2021 0251   VLDL 36 05/06/2021 0251   LDLCALC 213 (H) 05/06/2021 0251       Other studies Reviewed: Additional studies/ records that were reviewed today include: . LHC 05/06/21:   Acute coronary syndrome due to 99% obstruction  of the mid right coronary, thrombotic.  Successful overlapping stent from the proximal to the mid RCA reducing 99% stenosis to 0%. The proximal overlapping stent was required because of an edge dissection that occurred after deployment of the initial stent. Initial stent, 3.5 x 16 Synergy, overlapped with a 3.0 x 8 Synergy. The more proximal stent used to overlap was postdilated to 3.25 mm in diameter. Clinical suspicion is that initial dissection was secondary to oversizing with a 3.5 mm Synergy.  Left coronary could not be selectively engaged. Nonselective images were obtained and there is no left main disease, no significant proximal mid or distal LAD disease, widely patent proximal to mid circumflex  with irregularities and a distal obtuse marginal and also the bifurcation of the marginal.   Normal LV function.  Consider femoral cath if angiography needed in the future especially if there is suspicion that the left coronary may be the culprit.  RECOMMENDATIONS:   Aspirin and Plavix for 12 months.  Patient has a plethora of untreated cardiovascular risk factors. 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 LDL should be targeted to less than 70. Would quickly progress to PCSK9 therapy as she has a prior history of statin intolerance. The blood pressure is severely elevated and needs aggressive management to 130/80 mmHg range.  Eligible for discharge in a.m.  Upon leaving the Cath Lab the patient was still having residual mild chest discomfort that was slowly resolving. Suspicion is that there was microembolization.  Diagnostic Dominance: Right    Intervention        Echocardiogram 05/06/21:  1. Left ventricular ejection fraction, by estimation, is 60 to 65%. The  left ventricle has normal function. The left ventricle has no regional  wall motion abnormalities. Left ventricular diastolic parameters are  consistent with  Grade I diastolic  dysfunction (impaired relaxation).  2. Right ventricular systolic function is normal. The right ventricular  size is normal.  3. The mitral valve is normal in structure. Trivial mitral valve  regurgitation. No evidence of mitral stenosis.  4. The aortic valve is tricuspid. Aortic valve regurgitation is not  visualized. No aortic stenosis is present.  5. The inferior vena cava is normal in size with greater than 50%  respiratory variability, suggesting right atrial pressure of 3 mmHg.   ASSESSMENT AND PLAN:  1.  S/p NSTEMI with CAD and stent to RCA.  Residual disease RCA of only 45% Hospital records and labs reviewed.   Continue ASA and plavix for 1 year then ASA alone.  Continue statin and BB. To change to Dr. Atilano Median at [facility] with appt June 15th.  She will begin rehab on the 14th in [place].  2.  HTN controlled though occ elevation.  Will monitor for now due to soft at times and episode of dizziness.   3.  HLD now on Lipitor 80 mg, will recheck in 6 weeks with Dr. Gilman Schmidt office.  Her diet has changed as well. We discussed.  Hope that between the 2 her LDL will be < 70.  During hospitalization was 213.    4.  Pre-diabetes, with A1c of 6.3, is on lower carb diet.  Continue to monitor.    Current medicines are reviewed with the patient today.  The patient Has no concerns regarding medicines.  The following changes have been made:  See above Labs/ tests ordered today include:see above  Disposition:   FU:  see above  Signed, Cecilie Kicks, NP  05/15/2021 [facility], [place], [place], [place] Phone: [phone]; Fax: [phone]

## 2021-05-15 ENCOUNTER — Other Ambulatory Visit: Payer: Self-pay

## 2021-05-15 ENCOUNTER — Encounter: Payer: Self-pay | Admitting: Cardiology

## 2021-05-15 ENCOUNTER — Ambulatory Visit (INDEPENDENT_AMBULATORY_CARE_PROVIDER_SITE_OTHER): Payer: BLUE CROSS/BLUE SHIELD | Admitting: Cardiology

## 2021-05-15 VITALS — BP 122/60 | HR 69 | Ht 69.0 in | Wt 226.8 lb

## 2021-05-15 DIAGNOSIS — I25750 Atherosclerosis of native coronary artery of transplanted heart with unstable angina: Secondary | ICD-10-CM | POA: Diagnosis not present

## 2021-05-15 DIAGNOSIS — I214 Non-ST elevation (NSTEMI) myocardial infarction: Secondary | ICD-10-CM

## 2021-05-15 DIAGNOSIS — R7303 Prediabetes: Secondary | ICD-10-CM

## 2021-05-15 DIAGNOSIS — I10 Essential (primary) hypertension: Secondary | ICD-10-CM | POA: Diagnosis not present

## 2021-05-15 DIAGNOSIS — E782 Mixed hyperlipidemia: Secondary | ICD-10-CM

## 2021-05-15 NOTE — Patient Instructions (Signed)
Medication Instructions:  Your physician recommends that you continue on your current medications as directed. Please refer to the Current Medication list given to you today.  *If you need a refill on your cardiac medications before your next appointment, please call your pharmacy*   Lab Work: None ordered   If you have labs (blood work) drawn today and your tests are completely normal, you will receive your results only by: Marland Kitchen MyChart Message (if you have MyChart) OR . A paper copy in the mail If you have any lab test that is abnormal or we need to change your treatment, we will call you to review the results.   Testing/Procedures: None ordered    Follow-Up: No need for follow up    Other Instructions None ordered

## 2021-06-15 ENCOUNTER — Observation Stay (HOSPITAL_COMMUNITY): Payer: BLUE CROSS/BLUE SHIELD | Admitting: Certified Registered Nurse Anesthetist

## 2021-06-15 ENCOUNTER — Encounter (HOSPITAL_COMMUNITY)
Admission: EM | Disposition: A | Discharge status: Home / Self Care | Payer: Self-pay | Source: Home / Self Care | Attending: Family Medicine

## 2021-06-15 ENCOUNTER — Encounter (HOSPITAL_COMMUNITY): Payer: Self-pay | Admitting: *Deleted

## 2021-06-15 ENCOUNTER — Other Ambulatory Visit: Payer: Self-pay

## 2021-06-15 ENCOUNTER — Inpatient Hospital Stay (HOSPITAL_COMMUNITY)
Admission: EM | Admit: 2021-06-15 | Discharge: 2021-06-18 | DRG: 377 | Disposition: A | Discharge status: Home / Self Care | Payer: BLUE CROSS/BLUE SHIELD | Attending: Family Medicine | Admitting: Family Medicine

## 2021-06-15 DIAGNOSIS — I959 Hypotension, unspecified: Secondary | ICD-10-CM | POA: Diagnosis not present

## 2021-06-15 DIAGNOSIS — D500 Iron deficiency anemia secondary to blood loss (chronic): Secondary | ICD-10-CM | POA: Diagnosis present

## 2021-06-15 DIAGNOSIS — K5731 Diverticulosis of large intestine without perforation or abscess with bleeding: Secondary | ICD-10-CM | POA: Diagnosis not present

## 2021-06-15 DIAGNOSIS — E785 Hyperlipidemia, unspecified: Secondary | ICD-10-CM | POA: Diagnosis present

## 2021-06-15 DIAGNOSIS — K625 Hemorrhage of anus and rectum: Secondary | ICD-10-CM | POA: Diagnosis not present

## 2021-06-15 DIAGNOSIS — K921 Melena: Secondary | ICD-10-CM

## 2021-06-15 DIAGNOSIS — R1013 Epigastric pain: Secondary | ICD-10-CM | POA: Diagnosis not present

## 2021-06-15 DIAGNOSIS — E861 Hypovolemia: Secondary | ICD-10-CM | POA: Diagnosis present

## 2021-06-15 DIAGNOSIS — I10 Essential (primary) hypertension: Secondary | ICD-10-CM | POA: Diagnosis present

## 2021-06-15 DIAGNOSIS — K644 Residual hemorrhoidal skin tags: Secondary | ICD-10-CM | POA: Diagnosis present

## 2021-06-15 DIAGNOSIS — Z7982 Long term (current) use of aspirin: Secondary | ICD-10-CM

## 2021-06-15 DIAGNOSIS — I25750 Atherosclerosis of native coronary artery of transplanted heart with unstable angina: Secondary | ICD-10-CM | POA: Diagnosis not present

## 2021-06-15 DIAGNOSIS — Z8249 Family history of ischemic heart disease and other diseases of the circulatory system: Secondary | ICD-10-CM

## 2021-06-15 DIAGNOSIS — Z85828 Personal history of other malignant neoplasm of skin: Secondary | ICD-10-CM

## 2021-06-15 DIAGNOSIS — Z888 Allergy status to other drugs, medicaments and biological substances status: Secondary | ICD-10-CM

## 2021-06-15 DIAGNOSIS — Z79899 Other long term (current) drug therapy: Secondary | ICD-10-CM

## 2021-06-15 DIAGNOSIS — E782 Mixed hyperlipidemia: Secondary | ICD-10-CM | POA: Diagnosis not present

## 2021-06-15 DIAGNOSIS — K5730 Diverticulosis of large intestine without perforation or abscess without bleeding: Secondary | ICD-10-CM

## 2021-06-15 DIAGNOSIS — I2510 Atherosclerotic heart disease of native coronary artery without angina pectoris: Secondary | ICD-10-CM | POA: Diagnosis present

## 2021-06-15 DIAGNOSIS — Z972 Presence of dental prosthetic device (complete) (partial): Secondary | ICD-10-CM

## 2021-06-15 DIAGNOSIS — K922 Gastrointestinal hemorrhage, unspecified: Secondary | ICD-10-CM

## 2021-06-15 DIAGNOSIS — Z7902 Long term (current) use of antithrombotics/antiplatelets: Secondary | ICD-10-CM

## 2021-06-15 DIAGNOSIS — Z955 Presence of coronary angioplasty implant and graft: Secondary | ICD-10-CM

## 2021-06-15 DIAGNOSIS — Z20822 Contact with and (suspected) exposure to covid-19: Secondary | ICD-10-CM | POA: Diagnosis present

## 2021-06-15 DIAGNOSIS — I252 Old myocardial infarction: Secondary | ICD-10-CM

## 2021-06-15 DIAGNOSIS — R578 Other shock: Secondary | ICD-10-CM | POA: Diagnosis present

## 2021-06-15 HISTORY — PX: FLEXIBLE SIGMOIDOSCOPY: SHX5431

## 2021-06-15 HISTORY — PX: ESOPHAGOGASTRODUODENOSCOPY (EGD) WITH PROPOFOL: SHX5813

## 2021-06-15 LAB — CBC WITH DIFFERENTIAL/PLATELET
Abs Immature Granulocytes: 0.03 10*3/uL (ref 0.00–0.07)
Basophils Absolute: 0.1 10*3/uL (ref 0.0–0.1)
Basophils Relative: 1 %
Eosinophils Absolute: 0.1 10*3/uL (ref 0.0–0.5)
Eosinophils Relative: 2 %
HCT: 39.9 % (ref 36.0–46.0)
Hemoglobin: 12.9 g/dL (ref 12.0–15.0)
Immature Granulocytes: 0 %
Lymphocytes Relative: 27 %
Lymphs Abs: 2.2 10*3/uL (ref 0.7–4.0)
MCH: 30.4 pg (ref 26.0–34.0)
MCHC: 32.3 g/dL (ref 30.0–36.0)
MCV: 93.9 fL (ref 80.0–100.0)
Monocytes Absolute: 0.7 10*3/uL (ref 0.1–1.0)
Monocytes Relative: 9 %
Neutro Abs: 4.9 10*3/uL (ref 1.7–7.7)
Neutrophils Relative %: 61 %
Platelets: 274 10*3/uL (ref 150–400)
RBC: 4.25 MIL/uL (ref 3.87–5.11)
RDW: 13.1 % (ref 11.5–15.5)
WBC: 8 10*3/uL (ref 4.0–10.5)
nRBC: 0 % (ref 0.0–0.2)

## 2021-06-15 LAB — COMPREHENSIVE METABOLIC PANEL
ALT: 19 U/L (ref 0–44)
AST: 28 U/L (ref 15–41)
Albumin: 3.7 g/dL (ref 3.5–5.0)
Alkaline Phosphatase: 68 U/L (ref 38–126)
Anion gap: 7 (ref 5–15)
BUN: 18 mg/dL (ref 8–23)
CO2: 22 mmol/L (ref 22–32)
Calcium: 8.7 mg/dL — ABNORMAL LOW (ref 8.9–10.3)
Chloride: 107 mmol/L (ref 98–111)
Creatinine, Ser: 0.97 mg/dL (ref 0.44–1.00)
GFR, Estimated: >60 mL/min (ref >60)
Glucose, Bld: 124 mg/dL — ABNORMAL HIGH (ref 70–99)
Potassium: 4.6 mmol/L (ref 3.5–5.1)
Sodium: 136 mmol/L (ref 135–145)
Total Bilirubin: 1.1 mg/dL (ref 0.3–1.2)
Total Protein: 6.3 g/dL — ABNORMAL LOW (ref 6.5–8.1)

## 2021-06-15 LAB — TYPE AND SCREEN
ABO/RH(D): O POS
Antibody Screen: NEGATIVE

## 2021-06-15 LAB — HEMOGLOBIN AND HEMATOCRIT, BLOOD
HCT: 35.8 % — ABNORMAL LOW (ref 36.0–46.0)
HCT: 35 % — ABNORMAL LOW (ref 36.0–46.0)
Hemoglobin: 11.7 g/dL — ABNORMAL LOW (ref 12.0–15.0)
Hemoglobin: 11.6 g/dL — ABNORMAL LOW (ref 12.0–15.0)

## 2021-06-15 LAB — ABO/RH: ABO/RH(D): O POS

## 2021-06-15 LAB — PROTIME-INR
INR: 1 (ref 0.8–1.2)
Prothrombin Time: 13.6 seconds (ref 11.4–15.2)

## 2021-06-15 LAB — TROPONIN I (HIGH SENSITIVITY)
Troponin I (High Sensitivity): 4 ng/L (ref <18)
Troponin I (High Sensitivity): 5 ng/L (ref <18)

## 2021-06-15 LAB — RESP PANEL BY RT-PCR (FLU A&B, COVID) ARPGX2
Influenza A by PCR: NEGATIVE
Influenza B by PCR: NEGATIVE
SARS Coronavirus 2 by RT PCR: NEGATIVE

## 2021-06-15 LAB — ETHANOL: Alcohol, Ethyl (B): <10 mg/dL (ref <10)

## 2021-06-15 SURGERY — ESOPHAGOGASTRODUODENOSCOPY (EGD) WITH PROPOFOL
Anesthesia: General

## 2021-06-15 MED ORDER — ACETAMINOPHEN 650 MG RE SUPP: 650.0000 mg | Freq: Four times a day (QID) | RECTAL | Status: DC | PRN

## 2021-06-15 MED ORDER — DEXAMETHASONE SODIUM PHOSPHATE 10 MG/ML IJ SOLN
INTRAMUSCULAR | Status: DC | PRN
  Administered 2021-06-15: 5 mg via INTRAVENOUS

## 2021-06-15 MED ORDER — PROPOFOL 10 MG/ML IV BOLUS
INTRAVENOUS | Status: DC | PRN
  Administered 2021-06-15: 150 mg via INTRAVENOUS
  Administered 2021-06-15: 50 mg via INTRAVENOUS

## 2021-06-15 MED ORDER — SODIUM CHLORIDE 0.9% FLUSH
3.0000 mL | Freq: Two times a day (BID) | INTRAVENOUS | Status: DC
  Administered 2021-06-15 – 2021-06-18 (×6): 3 mL via INTRAVENOUS

## 2021-06-15 MED ORDER — EZETIMIBE 10 MG PO TABS
10.0000 mg | ORAL_TABLET | Freq: Every day | ORAL | Status: DC
  Administered 2021-06-16 – 2021-06-17 (×2): 10 mg via ORAL
  Filled 2021-06-15 (×3): qty 1

## 2021-06-15 MED ORDER — FENTANYL CITRATE (PF) 250 MCG/5ML IJ SOLN
INTRAMUSCULAR | Status: DC | PRN
  Administered 2021-06-15: 100 ug via INTRAVENOUS

## 2021-06-15 MED ORDER — SODIUM CHLORIDE 0.9 % IV BOLUS
1000.0000 mL | Freq: Once | INTRAVENOUS | Status: AC
  Administered 2021-06-15: 1000 mL via INTRAVENOUS

## 2021-06-15 MED ORDER — ONDANSETRON HCL 4 MG PO TABS: 4.0000 mg | ORAL_TABLET | Freq: Four times a day (QID) | ORAL | Status: DC | PRN

## 2021-06-15 MED ORDER — CLOPIDOGREL BISULFATE 75 MG PO TABS
75.0000 mg | ORAL_TABLET | Freq: Every day | ORAL | Status: DC
  Administered 2021-06-17 – 2021-06-18 (×2): 75 mg via ORAL
  Filled 2021-06-15 (×3): qty 1

## 2021-06-15 MED ORDER — LACTATED RINGERS IV SOLN: INTRAVENOUS | Status: DC | PRN

## 2021-06-15 MED ORDER — PHENYLEPHRINE HCL-NACL 10-0.9 MG/250ML-% IV SOLN
INTRAVENOUS | Status: DC | PRN
  Administered 2021-06-15: 20 ug/min via INTRAVENOUS

## 2021-06-15 MED ORDER — CALCIUM GLUCONATE-NACL 1-0.675 GM/50ML-% IV SOLN: 1.0000 g | Freq: Once | INTRAVENOUS | Status: DC

## 2021-06-15 MED ORDER — ATORVASTATIN CALCIUM 80 MG PO TABS
80.0000 mg | ORAL_TABLET | Freq: Every day | ORAL | Status: DC
  Administered 2021-06-16 – 2021-06-18 (×3): 80 mg via ORAL
  Filled 2021-06-15 (×3): qty 1

## 2021-06-15 MED ORDER — PANTOPRAZOLE SODIUM 40 MG IV SOLR: 40.0000 mg | Freq: Two times a day (BID) | INTRAVENOUS | Status: DC

## 2021-06-15 MED ORDER — ACETAMINOPHEN 325 MG PO TABS: 650.0000 mg | ORAL_TABLET | Freq: Four times a day (QID) | ORAL | Status: DC | PRN

## 2021-06-15 MED ORDER — PANTOPRAZOLE SODIUM 40 MG PO TBEC
40.0000 mg | DELAYED_RELEASE_TABLET | Freq: Every day | ORAL | Status: DC
  Administered 2021-06-16 – 2021-06-18 (×3): 40 mg via ORAL
  Filled 2021-06-15 (×3): qty 1

## 2021-06-15 MED ORDER — ONDANSETRON HCL 4 MG/2ML IJ SOLN
INTRAMUSCULAR | Status: DC | PRN
  Administered 2021-06-15: 4 mg via INTRAVENOUS

## 2021-06-15 MED ORDER — PHENYLEPHRINE 40 MCG/ML (10ML) SYRINGE FOR IV PUSH (FOR BLOOD PRESSURE SUPPORT)
PREFILLED_SYRINGE | INTRAVENOUS | Status: DC | PRN
  Administered 2021-06-15 (×2): 80 ug via INTRAVENOUS

## 2021-06-15 MED ORDER — ALBUTEROL SULFATE (2.5 MG/3ML) 0.083% IN NEBU: 2.5000 mg | INHALATION_SOLUTION | Freq: Four times a day (QID) | RESPIRATORY_TRACT | Status: DC | PRN

## 2021-06-15 MED ORDER — ONDANSETRON HCL 4 MG/2ML IJ SOLN: 4.0000 mg | Freq: Four times a day (QID) | INTRAMUSCULAR | Status: DC | PRN

## 2021-06-15 MED ORDER — ASPIRIN 81 MG PO CHEW
81.0000 mg | CHEWABLE_TABLET | Freq: Every day | ORAL | Status: DC
  Administered 2021-06-16 – 2021-06-18 (×3): 81 mg via ORAL
  Filled 2021-06-15 (×3): qty 1

## 2021-06-15 MED ORDER — LIDOCAINE 2% (20 MG/ML) 5 ML SYRINGE
INTRAMUSCULAR | Status: DC | PRN
  Administered 2021-06-15: 60 mg via INTRAVENOUS

## 2021-06-15 MED ORDER — SUCCINYLCHOLINE CHLORIDE 200 MG/10ML IV SOSY
PREFILLED_SYRINGE | INTRAVENOUS | Status: DC | PRN
  Administered 2021-06-15: 90 mg via INTRAVENOUS

## 2021-06-15 SURGICAL SUPPLY — 14 items

## 2021-06-15 NOTE — Transfer of Care (Signed)
Immediate Anesthesia Transfer of Care Note  Patient: Heather Pace  Procedure(s) Performed: ESOPHAGOGASTRODUODENOSCOPY (EGD) WITH PROPOFOL FLEXIBLE SIGMOIDOSCOPY  Patient Location: Endoscopy Unit  Anesthesia Type:General  Level of Consciousness: awake, alert  and oriented  Airway & Oxygen Therapy: Patient Spontanous Breathing  Post-op Assessment: Report given to RN and Post -op Vital signs reviewed and stable  Post vital signs: Reviewed and stable  Last Vitals:  Vitals Value Taken Time  BP 145/61 06/15/21 1600  Temp 36.3 C 06/15/21 1600  Pulse 87 06/15/21 1601  Resp 15 06/15/21 1601  SpO2 96 % 06/15/21 1601  Vitals shown include unvalidated device data.  Last Pain:  Vitals:   06/15/21 1600  TempSrc: Temporal  PainSc: 0-No pain         Complications: No notable events documented.

## 2021-06-15 NOTE — Consult Note (Addendum)
Consultation  Referring Provider: Zacarias Pontes, ER/[place] PA Primary Care Physician:  [name], MD Primary Gastroenterologist:  Dr. Ardis Hughs  Reason for Consultation: Acute GI bleed  HPI: Heather Pace is a 60 y.o. female, known to Dr. Ardis Hughs from prior colonoscopy done for screening in January 2019 at which time she was found to have pandiverticulosis and no colon polyps. Patient presents to the emergency room today after onset about 5 AM this morning with acute GI bleeding with multiple episodes of dark red blood and clots.  She came to the emergency room, had to wear a diaper and had 2 large episodes of dark blood and clot which she showed me a picture of.  She has not had any further bleeding over the past hour.  Patient was diaphoretic and dizzy in triage, feels better lying down and with fluids systolic blood pressure is above 100 currently. She has not had any nausea or vomiting. Interestingly she says she has been having some upper abdominal pain/discomfort over the past 3 days.  She initially noticed this after she had eaten seafood at the beach but says its been waxing and waning since and bothers her more at night when she is lying down, feels totally different than the chest pain she had with her recent MI.  Patient has not had any prior GI bleeding.  She was just started on aspirin and Plavix about 5 weeks ago after she had an acute MI with 99% RCA at cath.  She had overlapping stents placed.  On arrival in the ER today hemoglobin 12.9 hematocrit 39.9, baseline hemoglobin 13.25-month ago BUN 18/creatinine 0.97 Troponin 4, ethanol less than 10 INR is pending   Past Medical History:  Diagnosis Date   Coronary artery disease    Hyperlipidemia    not on meds   NSTEMI (non-ST elevated myocardial infarction) ([place]) 04/2021   Rosacea    S/P angioplasty with stent 04/2021   Seborrheic keratoses    Skin cancer     Past Surgical History:  Procedure Laterality Date    BACK SURGERY  2002   Hemilaminotomy right L5-S1 with disk excision and S1 nerve root   BREAST REDUCTION SURGERY     COLONOSCOPY  2019   CORONARY STENT INTERVENTION N/A 05/06/2021   Procedure: CORONARY STENT INTERVENTION;  Surgeon: Belva Crome, MD;  Location: [facility];  Service: Cardiovascular;  Laterality: N/A;   LEFT HEART CATH AND CORONARY ANGIOGRAPHY N/A 05/06/2021   Procedure: LEFT HEART CATH AND CORONARY ANGIOGRAPHY;  Surgeon: Belva Crome, MD;  Location: [facility];  Service: Cardiovascular;  Laterality: N/A;   LESION REMOVAL Bilateral 01/18/2018   skin lesion X5   PLANTAR FASCIECTOMY Bilateral     Prior to Admission medications   Medication Sig Start Date End Date Taking? Authorizing Provider  aspirin 81 MG chewable tablet Chew 1 tablet (81 mg total) by mouth daily. 05/08/21   Tommie Raymond, NP  atorvastatin (LIPITOR) 80 MG tablet Take 1 tablet (80 mg total) by mouth daily. 05/08/21   Kathyrn Drown D, NP  clopidogrel (PLAVIX) 75 MG tablet Take 1 tablet (75 mg total) by mouth daily with breakfast. 05/08/21   Kathyrn Drown D, NP  ezetimibe (ZETIA) 10 MG tablet Take 10 mg by mouth daily. 06/10/21   [provider]  losartan (COZAAR) 25 MG tablet Take 1 tablet (25 mg total) by mouth daily. 05/08/21   Tommie Raymond, NP  metoprolol tartrate (LOPRESSOR) 25  MG tablet Take 1 tablet (25 mg total) by mouth 2 (two) times daily. 05/07/21   Tommie Raymond, NP  nitroGLYCERIN (NITROSTAT) 0.4 MG SL tablet Place 1 tablet (0.4 mg total) under the tongue every 5 (five) minutes x 3 doses as needed for chest pain. 05/07/21   Tommie Raymond, NP    Current Facility-Administered Medications  Medication Dose Route Frequency Provider Last Rate Last Admin   0.9 %  sodium chloride infusion  500 mL Intravenous Continuous Milus Banister, MD       Current Outpatient Medications  Medication Sig Dispense Refill   aspirin 81 MG chewable tablet Chew 1 tablet (81 mg total) by  mouth daily. 90 tablet 2   atorvastatin (LIPITOR) 80 MG tablet Take 1 tablet (80 mg total) by mouth daily. 60 tablet 1   clopidogrel (PLAVIX) 75 MG tablet Take 1 tablet (75 mg total) by mouth daily with breakfast. 90 tablet 2   ezetimibe (ZETIA) 10 MG tablet Take 10 mg by mouth daily.     losartan (COZAAR) 25 MG tablet Take 1 tablet (25 mg total) by mouth daily. 90 tablet 2   metoprolol tartrate (LOPRESSOR) 25 MG tablet Take 1 tablet (25 mg total) by mouth 2 (two) times daily. 120 tablet 2   nitroGLYCERIN (NITROSTAT) 0.4 MG SL tablet Place 1 tablet (0.4 mg total) under the tongue every 5 (five) minutes x 3 doses as needed for chest pain. 25 tablet 0    Allergies as of 06/15/2021 - Review Complete 06/15/2021  Allergen Reaction Noted   Atorvastatin Other (See Comments) 05/05/2021   Bupropion  05/05/2021   Rosuvastatin  05/05/2021    Family History  Problem Relation Age of Onset   Breast cancer Mother 7   Heart attack Father 81   Heart disease Brother        in his 52s   Colon cancer Neg Hx    Colon polyps Neg Hx    Esophageal cancer Neg Hx    Rectal cancer Neg Hx    Stomach cancer Neg Hx     Social History   Socioeconomic History   Marital status: Single    Spouse name: Not on file   Number of children: Not on file   Years of education: Not on file   Highest education level: Not on file  Occupational History   Not on file  Tobacco Use   Smoking status: Never   Smokeless tobacco: Never  Vaping Use   Vaping Use: Never used  Substance and Sexual Activity   Alcohol use: No   Drug use: No   Sexual activity: Not on file  Other Topics Concern   Not on file  Social History Narrative   Not on file   Social Determinants of Health   Financial Resource Strain: Not on file  Food Insecurity: Not on file  Transportation Needs: Not on file  Physical Activity: Not on file  Stress: Not on file  Social Connections: Not on file  Intimate Partner Violence: Not on file     Review of Systems: Pertinent positive and negative review of systems were noted in the above HPI section.  All other review of systems was otherwise negative.   Physical Exam: Vital signs in last 24 hours: Temp:  [98 F (36.7 C)] 98 F (36.7 C) (06/20 1014) Pulse Rate:  [72-96] 72 (06/20 1300) Resp:  [10-16] 10 (06/20 1300) BP: (84-169)/(52-92) 120/66 (06/20 1300) SpO2:  [100 %] 100 % (06/20  1300)   General:   Alert,  Well-developed, well-nourished, older white female pleasant and cooperative in NAD Head:  Normocephalic and atraumatic. Eyes:  Sclera clear, no icterus.   Conjunctiva pink. Ears:  Normal auditory acuity. Nose:  No deformity, discharge,  or lesions. Mouth:  No deformity or lesions.   Neck:  Supple; no masses or thyromegaly. Lungs:  Clear throughout to auscultation.   No wheezes, crackles, or rhonchi. Heart:  Regular rate and rhythm; no murmurs, clicks, rubs,  or gallops. Abdomen:  Soft, mild tenderness across the epigastrium no guarding or rebound no palpable mass BS active,nonpalp hepatosplenomegaly Rectal: Not done Msk:  Symmetrical without gross deformities. . Pulses:  Normal pulses noted. Extremities:  Without clubbing or edema. Neurologic:  Alert and  oriented x4;  grossly normal neurologically. Skin:  Intact without significant lesions or rashes.. Psych:  Alert and cooperative. Normal mood and affect.   Lab Results: Recent Labs    06/15/21 1155  WBC 8.0  HGB 12.9  HCT 39.9  PLT 274   BMET Recent Labs    06/15/21 1155  NA 136  K 4.6  CL 107  CO2 22  GLUCOSE 124*  BUN 18  CREATININE 0.97  CALCIUM 8.7*   LFT Recent Labs    06/15/21 1155  PROT 6.3*  ALBUMIN 3.7  AST 28  ALT 19  ALKPHOS 24  BILITOT 1.1       IMPRESSION:   #56 60 year old white female status post acute MI 05/06/2021 with 99% RCA lesion at cath with overlapping stents placed and started on Plavix and aspirin  #2 acute GI bleed onset earlier today with dark red  blood and clots.  Patient has had multiple episodes since onset associated with weakness dizziness and diaphoresis Initial hemoglobin 12.9 but suspect that will show significant drop Etiology of bleeding is not clear, BUN is normal speaking against upper GI source however she has been having upper abdominal pain over the past 3 to 4 days of unclear etiology and therefore need to consider acute peptic ulcer disease She does have previously documented pandiverticulosis on colonoscopy January 3419 and certainly this could be a diverticular hemorrhage.  #3  Hyperlipidemia   PLAN:   Keep n.p.o. for now IV PPI twice daily Every 6 hour hemoglobins and transfuse for hemoglobin 8 or less Hold Plavix today, then will need to discuss further with cardiology  Patient will be scheduled for upper endoscopy and possible flexible sigmoidoscopy this afternoon with Dr. Carlean Purl.  Both procedures have been discussed in detail with the patient including indications risk and benefits and she is agreeable to proceed.  Further recommendations pending findings of above.  GI will follow with you    Amy Esterwood PA-C 06/15/2021, 1:51 PM   Attending  Very brisk GI bleed that is not clear  ast source. EGD today because of magnitude of observed hematochezia and upper abd pain - ? PUD  If that is negative will do a flex sig  The risks and benefits as well as alternatives of endoscopic procedure(s) have been discussed and reviewed. All questions answered. The patient agrees to proceed.    Gatha Mayer, MD, [facility] 06/15/2021 3:14 PM

## 2021-06-15 NOTE — ED Provider Notes (Signed)
[facility] EMERGENCY DEPARTMENT Provider Note   CSN: [id number] Arrival date & time: 06/15/21  7829     History Chief Complaint  Patient presents with   Diarrhea    Heather Pace is a 60 y.o. female presenting for evaluation of rectal bleeding.  Patient states since 5:00 this morning, she has had rectal bleeding.  She has had at least 6 episodes of bloody diarrhea.  Patient states she feels lightheaded and sweaty.  The symptoms come and go.  She is on Plavix and aspirin due to a recent NSTEMI with stent placement.  She has had a colonoscopy before, but does not know with whom.  She denies fever, vomiting, abdominal pain.  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 ([place])    Unstable angina ([place]) 05/05/2021    Past Surgical History:  Procedure Laterality Date   BACK SURGERY  2002   Hemilaminotomy right L5-S1 with disk excision and S1 nerve root   BREAST REDUCTION SURGERY     COLONOSCOPY  2019   CORONARY STENT INTERVENTION N/A 05/06/2021   Procedure: CORONARY STENT INTERVENTION;  Surgeon: Belva Crome, MD;  Location: [place] [facility];  Service: Cardiovascular;  Laterality: N/A;   LEFT HEART CATH AND CORONARY ANGIOGRAPHY N/A 05/06/2021   Procedure: LEFT HEART CATH AND CORONARY ANGIOGRAPHY;  Surgeon: Belva Crome, MD;  Location: [place] [facility];  Service: Cardiovascular;  Laterality: N/A;   LESION REMOVAL Bilateral 01/18/2018   skin lesion X5   PLANTAR FASCIECTOMY Bilateral      OB  History   No obstetric history on file.     Family History  Problem Relation Age of Onset   Breast cancer Mother 46   Heart attack Father 74   Heart disease Brother        in his 81s   Colon cancer Neg Hx    Colon polyps Neg Hx    Esophageal cancer Neg Hx    Rectal cancer Neg Hx    Stomach cancer Neg Hx     Social History   Tobacco Use   Smoking status: Never   Smokeless tobacco: Never  Vaping Use   Vaping Use: Never used  Substance Use Topics   Alcohol use: No   Drug use: No    Home Medications Prior to Admission medications   Medication Sig Start Date End Date Taking? Authorizing Provider  aspirin 81 MG chewable tablet Chew 1 tablet (81 mg total) by mouth daily. 05/08/21  Yes Kathyrn Drown D, NP  atorvastatin (LIPITOR) 80 MG tablet Take 1 tablet (80 mg total) by mouth daily. 05/08/21  Yes Kathyrn Drown D, NP  clopidogrel (PLAVIX) 75 MG tablet Take 1 tablet (75 mg total) by mouth daily with breakfast. 05/08/21  Yes Kathyrn Drown D, NP  ezetimibe (ZETIA) 10 MG tablet Take 10 mg by mouth daily. 06/10/21  Yes [provider]  losartan (COZAAR) 25 MG tablet Take 1 tablet (25  mg total) by mouth daily. 05/08/21  Yes Kathyrn Drown D, NP  metoprolol tartrate (LOPRESSOR) 25 MG tablet Take 1 tablet (25 mg total) by mouth 2 (two) times daily. 05/07/21  Yes Kathyrn Drown D, NP  nitroGLYCERIN (NITROSTAT) 0.4 MG SL tablet Place 1 tablet (0.4 mg total) under the tongue every 5 (five) minutes x 3 doses as needed for chest pain. 05/07/21  Yes Kathyrn Drown D, NP    Allergies    Atorvastatin, Bupropion, and Rosuvastatin  Review of Systems   Review of Systems  Constitutional:  Positive for diaphoresis.  Gastrointestinal:  Positive for anal bleeding.  Neurological:  Positive for light-headedness.  Hematological:  Bruises/bleeds easily.  All other systems reviewed and are negative.  Physical Exam Updated Vital Signs BP 120/66   Pulse 72   Temp 98 F (36.7 C) (Oral)    Resp 10   SpO2 100%   Physical Exam Vitals and nursing note reviewed. Exam conducted with a chaperone present.  Constitutional:      Appearance: She is diaphoretic.  HENT:     Head: Normocephalic and atraumatic.  Eyes:     Conjunctiva/sclera: Conjunctivae normal.     Pupils: Pupils are equal, round, and reactive to light.  Cardiovascular:     Rate and Rhythm: Normal rate and regular rhythm.     Pulses: Normal pulses.  Pulmonary:     Effort: Pulmonary effort is normal. No respiratory distress.     Breath sounds: Normal breath sounds. No wheezing.     Comments: Speaking in full sentences.  Clear lung sounds in all fields. Abdominal:     General: There is no distension.     Palpations: Abdomen is soft. There is no mass.     Tenderness: There is no abdominal tenderness. There is no guarding or rebound.  Genitourinary:    Comments: Merita Norton, RN, present as chaperone Patient with gross bleeding noted coming from the rectum with dark clots.  1 external hemorrhoid noted, however this does not appear to be the source Musculoskeletal:        General: Normal range of motion.     Cervical back: Normal range of motion and neck supple.  Skin:    General: Skin is warm.     Capillary Refill: Capillary refill takes less than 2 seconds.     Coloration: Skin is pale.  Neurological:     Mental Status: She is alert and oriented to person, place, and time.  Psychiatric:        Mood and Affect: Mood and affect normal.        Speech: Speech normal.        Behavior: Behavior normal.    ED Results / Procedures / Treatments   Labs (all labs ordered are listed, but only abnormal results are displayed) Labs Reviewed  COMPREHENSIVE METABOLIC PANEL - Abnormal; Notable for the following components:      Result Value   Glucose, Bld 124 (*)    Calcium 8.7 (*)    Total Protein 6.3 (*)    All other components within normal limits  RESP PANEL BY RT-PCR (FLU A&B, COVID) ARPGX2  CBC WITH  DIFFERENTIAL/PLATELET  ETHANOL  PROTIME-INR  HEMOGLOBIN AND HEMATOCRIT, BLOOD  HEMOGLOBIN AND HEMATOCRIT, BLOOD  POC OCCULT BLOOD, ED  TYPE AND SCREEN  ABO/RH  TROPONIN I (HIGH SENSITIVITY)  TROPONIN I (HIGH SENSITIVITY)    EKG EKG Interpretation  Date/Time:  Monday June 15 2021 11:49:36 EDT Ventricular Rate:  78 PR  Interval:  136 QRS Duration: 78 QT Interval:  362 QTC Calculation: 412 R Axis:   71 Text Interpretation: Normal sinus rhythm Nonspecific ST abnormality Abnormal ECG flipped t wave in lead III no longer present Otherwise no significant change Confirmed by Deno Etienne [phone]) on 06/15/2021 12:34:59 PM  Radiology No results found.  Procedures .Critical Care  Date/Time: 06/15/2021 2:13 PM Performed by: Franchot Heidelberg, PA-C Authorized by: Franchot Heidelberg, PA-C   Critical care provider statement:    Critical care time (minutes):  50   Critical care time was exclusive of:  Separately billable procedures and treating other patients and teaching time   Critical care was necessary to treat or prevent imminent or life-threatening deterioration of the following conditions:  Circulatory failure   Critical care was time spent personally by me on the following activities:  Blood draw for specimens, development of treatment plan with patient or surrogate, discussions with consultants, evaluation of patient's response to treatment, examination of patient, obtaining history from patient or surrogate, ordering and performing treatments and interventions, ordering and review of laboratory studies, ordering and review of radiographic studies, pulse oximetry, re-evaluation of patient's condition and review of old charts   I assumed direction of critical care for this patient from another provider in my specialty: no     Care discussed with: admitting provider   Comments:     Pt with rectal bleeding with presyncope symptoms and low BP.    Medications Ordered in ED Medications   ezetimibe (ZETIA) tablet 10 mg (has no administration in time range)  atorvastatin (LIPITOR) tablet 80 mg (has no administration in time range)  sodium chloride flush (NS) 0.9 % injection 3 mL (has no administration in time range)  calcium gluconate 1 g/ 50 mL sodium chloride IVPB (has no administration in time range)  acetaminophen (TYLENOL) tablet 650 mg (has no administration in time range)    Or  acetaminophen (TYLENOL) suppository 650 mg (has no administration in time range)  ondansetron (ZOFRAN) tablet 4 mg (has no administration in time range)    Or  ondansetron (ZOFRAN) injection 4 mg (has no administration in time range)  albuterol (PROVENTIL) (2.5 MG/3ML) 0.083% nebulizer solution 2.5 mg (has no administration in time range)  sodium chloride 0.9 % bolus 1,000 mL (1,000 mLs Intravenous New Bag/Given 06/15/21 1312)  sodium chloride 0.9 % bolus 1,000 mL (1,000 mLs Intravenous New Bag/Given 06/15/21 1313)    ED Course  I have reviewed the triage vital signs and the nursing notes.  Pertinent labs & imaging results that were available during my care of the patient were reviewed by me and considered in my medical decision making (see chart for details).    MDM Rules/Calculators/A&P                          Patient presenting for evaluation of rectal bleeding.  On exam, patient appears slightly diaphoretic and pale.  She has frank rectal bleeding on my exam and is on Plavix and aspirin.  She was noted to have low blood pressure in triage and is reporting lightheadedness and diaphoresis intermittently.  While her low blood pressure could be due to vasovagal syndrome, in the setting of bleeding, concern for anemia.  As such, she will need to be admitted for trending hemoglobin.  She is not having any fever or abdominal pains, low suspicion for diverticulitis or ischemic colitis.  Will consult with GI.  Discussed with Nicoletta Ba, PA-C  from [place] who will evaluate patient.  Recommends  fluids at this time.  Labs interpreted by me, hemoglobin stable.  Electrolytes stable.  Will call for admission.  Discussed with Dr. Tamala Julian from [facility] hospitalist service, patient to be admitted.  Final Clinical Impression(s) / ED Diagnoses Final diagnoses:  Rectal bleeding    Rx / DC Orders ED Discharge Orders     None        Franchot Heidelberg, PA-C 06/15/21 [place], [place], DO 06/15/21 1518

## 2021-06-15 NOTE — ED Notes (Signed)
Pt ambulated to restroom without incident.

## 2021-06-15 NOTE — Op Note (Signed)
[facility] Patient Name: Heather Pace Procedure Date : 06/15/2021 MRN: [id number] Attending MD: Gatha Mayer , MD Date of Birth: Nov 15, 1961 CSN: [id number] Age: 60 Admit Type: Emergency Department Procedure:                Upper GI endoscopy Indications:              Hematochezia Providers:                Gatha Mayer, MD, Katina Degree, RN, Lesia Sago, Technician Referring MD:              Medicines:                General Anesthesia Complications:            No immediate complications. Estimated Blood Loss:     Estimated blood loss: none. Procedure:                Pre-Anesthesia Assessment:                           - Prior to the procedure, a History and Physical                            was performed, and patient medications and                            allergies were reviewed. The patient's tolerance of                            previous anesthesia was also reviewed. The risks                            and benefits of the procedure and the sedation                            options and risks were discussed with the patient.                            All questions were answered, and informed consent                            was obtained. Prior Anticoagulants: The patient                            last took Plavix (clopidogrel) on the day of the                            procedure. ASA Grade Assessment: III - A patient                            with severe systemic disease. After reviewing the  risks and benefits, the patient was deemed in                            satisfactory condition to undergo the procedure.                           After obtaining informed consent, the endoscope was                            passed under direct vision. Throughout the                            procedure, the patient's blood pressure, pulse, and                            oxygen saturations were  monitored continuously. The                            GIF-H190 ([id number] Olympus gastroscope was                            introduced through the mouth, and advanced to the                            second part of duodenum. The upper GI endoscopy was                            accomplished without difficulty. The patient                            tolerated the procedure well. Scope In: Scope Out: Findings:      The esophagus was normal.      The stomach was normal.      The examined duodenum was normal.      The cardia and gastric fundus were normal on retroflexion. Impression:               - Normal esophagus.                           - Normal stomach. Slight food retention but                            irrigated and suctioned                           - Normal examined duodenum.                           - No specimens collected.                           - NO CAUSE GI BLEEDING SEEN - NO BLOOD SEEN AT ALL Recommendation:           - See the other procedure note for documentation of  additional recommendations. FLEX SIG NEXT Procedure Code(s):        --- Professional ---                           [phone], Esophagogastroduodenoscopy, flexible,                            transoral; diagnostic, including collection of                            specimen(s) by brushing or washing, when performed                            (separate procedure) Diagnosis Code(s):        --- Professional ---                           K92.1, Melena (includes Hematochezia) CPT copyright 2019 [employer]. All rights reserved. The codes documented in this report are preliminary and upon coder review may  be revised to meet current compliance requirements. Gatha Mayer, MD 06/15/2021 3:55:08 PM This report has been signed electronically. Number of Addenda: 0

## 2021-06-15 NOTE — ED Triage Notes (Signed)
Pt reports recent upper abd pain, today began having several episodes of diarrhea with blood in stools. Pt takes plavix. Became diaphoretic and dizzy after ambulating.

## 2021-06-15 NOTE — H&P (Addendum)
History and Physical    Heather Pace CXK:[id number] DOB: 1961/01/21 DOA: 06/15/2021  Referring MD/NP/PA: [name] PCP: [name], MD  Patient coming from: Home  Chief Complaint: Rectal bleeding  I have personally briefly reviewed patient's old medical records in [place]   HPI: Heather Pace is a 60 y.o. female with medical history significant of HLD, CAD, and NSTEMI s/p stent to the proximal to mid RCA in 04/2021 presents with complaints of rectal bleeding starting this morning around 5 AM.  She had approximately 5 episodes of bright red blood with clots present while at home and since being here in the hospital has had 2.  At home during episodes patient had become diaphoretic.  With the two episodes here in the hospital patient came lightheaded and felt as though she could pass out in addition to becoming diaphoretic.  She reports having some discomfort of her stomach epigastrically and since she has been on the dual antiplatelet therapy as noticed some intermittent bruising. She drinks alcohol occasionally throughout the week n usually just 2 beers or so but does not drink on a daily basis.  She has been taking Plavix and aspirin had prescribed and last took it this morning after onset of symptoms.  She is not on any other blood thinners.  Denies having any recent nausea, vomiting, fever, shortness of breath, or chest pain symptoms.  ED Course: Upon admission into the emergency department patient was seen to be afebrile, blood pressures transiently as low as 84/52-169/92.  Labs revealed globin 12.9, BUN 18, creatinine 0.97, calcium 8.7, and all other labs relatively within normal limits.  It was time in screening for the possible need of blood products.  She had been ordered 2 L of normal saline IV fluids.  GI was formally consulted.   Review of Systems  Constitutional:  Positive for diaphoresis. Negative for fever.  HENT:  Negative for congestion and nosebleeds.    Eyes:  Negative for photophobia and pain.  Respiratory:  Negative for cough and shortness of breath.   Cardiovascular:  Negative for chest pain and leg swelling.  Gastrointestinal:  Positive for abdominal pain and blood in stool. Negative for nausea and vomiting.  Genitourinary:  Negative for dysuria and hematuria.  Musculoskeletal:  Negative for falls.  Neurological:  Positive for dizziness. Negative for focal weakness and loss of consciousness.  Endo/Heme/Allergies:  Bruises/bleeds easily.  Psychiatric/Behavioral:  Negative for substance abuse.    Past Medical History:  Diagnosis Date   Coronary artery disease    Hyperlipidemia    not on meds   NSTEMI (non-ST elevated myocardial infarction) ([place]) 04/2021   Rosacea    S/P angioplasty with stent 04/2021   Seborrheic keratoses    Skin cancer     Past Surgical History:  Procedure Laterality Date   BACK SURGERY  2002   Hemilaminotomy right L5-S1 with disk excision and S1 nerve root   BREAST REDUCTION SURGERY     COLONOSCOPY  2019   CORONARY STENT INTERVENTION N/A 05/06/2021   Procedure: CORONARY STENT INTERVENTION;  Surgeon: Belva Crome, MD;  Location: [facility];  Service: Cardiovascular;  Laterality: N/A;   LEFT HEART CATH AND CORONARY ANGIOGRAPHY N/A 05/06/2021   Procedure: LEFT HEART CATH AND CORONARY ANGIOGRAPHY;  Surgeon: Belva Crome, MD;  Location: [facility];  Service: Cardiovascular;  Laterality: N/A;   LESION REMOVAL Bilateral 01/18/2018   skin lesion X5   PLANTAR FASCIECTOMY Bilateral  reports that she has never smoked. She has never used smokeless tobacco. She reports that she does not drink alcohol and does not use drugs.  Allergies  Allergen Reactions   Atorvastatin Other (See Comments)    Myalgias    Bupropion    Rosuvastatin     Family History  Problem Relation Age of Onset   Breast cancer Mother 29   Heart attack Father 78   Heart disease Brother        in his 79s   Colon  cancer Neg Hx    Colon polyps Neg Hx    Esophageal cancer Neg Hx    Rectal cancer Neg Hx    Stomach cancer Neg Hx     Prior to Admission medications   Medication Sig Start Date End Date Taking? Authorizing Provider  aspirin 81 MG chewable tablet Chew 1 tablet (81 mg total) by mouth daily. 05/08/21   Tommie Raymond, NP  atorvastatin (LIPITOR) 80 MG tablet Take 1 tablet (80 mg total) by mouth daily. 05/08/21   Kathyrn Drown D, NP  clopidogrel (PLAVIX) 75 MG tablet Take 1 tablet (75 mg total) by mouth daily with breakfast. 05/08/21   Kathyrn Drown D, NP  ezetimibe (ZETIA) 10 MG tablet Take 10 mg by mouth daily. 06/10/21   [provider]  losartan (COZAAR) 25 MG tablet Take 1 tablet (25 mg total) by mouth daily. 05/08/21   Tommie Raymond, NP  metoprolol tartrate (LOPRESSOR) 25 MG tablet Take 1 tablet (25 mg total) by mouth 2 (two) times daily. 05/07/21   Tommie Raymond, NP  nitroGLYCERIN (NITROSTAT) 0.4 MG SL tablet Place 1 tablet (0.4 mg total) under the tongue every 5 (five) minutes x 3 doses as needed for chest pain. 05/07/21   Tommie Raymond, NP    Physical Exam:  Constitutional: Middle-age female who appears to be in no acute distress Vitals:   06/15/21 1014 06/15/21 1149 06/15/21 1300  BP: (!) 169/92 (!) 84/52 120/66  Pulse: 96  72  Resp: 16  10  Temp: 98 F (36.7 C)    TempSrc: Oral    SpO2: 100%  100%   Eyes: PERRL, lids and conjunctivae normal ENMT: Mucous membranes are dry. Posterior pharynx clear of any exudate or lesions.  Neck: normal, supple, no masses, no thyromegaly Respiratory: clear to auscultation bilaterally, no wheezing, no crackles. Normal respiratory effort. No accessory muscle use.  Cardiovascular: Regular rate and rhythm, no murmurs / rubs / gallops. No extremity edema. 2+ pedal pulses. No carotid bruits.  Abdomen: Mild tenderness to palpation epigastric, no masses palpated. No hepatosplenomegaly. Bowel sounds positive.  Musculoskeletal: no  clubbing / cyanosis. No joint deformity upper and lower extremities. Good ROM, no contractures. Normal muscle tone.  Skin: no rashes, lesions, ulcers. No induration Neurologic: CN 2-12 grossly intact.  Strength 5/5 in all 4 extremities Psychiatric: Normal judgment and insight. Alert and oriented x 3. Normal mood.     Labs on Admission: I have personally reviewed following labs and imaging studies  CBC: Recent Labs  Lab 06/15/21 1155  WBC 8.0  NEUTROABS 4.9  HGB 12.9  HCT 39.9  MCV 93.9  PLT 301   Basic Metabolic Panel: Recent Labs  Lab 06/15/21 1155  NA 136  K 4.6  CL 107  CO2 22  GLUCOSE 124*  BUN 18  CREATININE 0.97  CALCIUM 8.7*   GFR: CrCl cannot be calculated (Unknown ideal weight.). Liver Function Tests: Recent Labs  Lab 06/15/21  1155  AST 28  ALT 19  ALKPHOS 68  BILITOT 1.1  PROT 6.3*  ALBUMIN 3.7   No results for input(s): LIPASE, AMYLASE in the last 168 hours. No results for input(s): AMMONIA in the last 168 hours. Coagulation Profile: No results for input(s): INR, PROTIME in the last 168 hours. Cardiac Enzymes: No results for input(s): CKTOTAL, CKMB, CKMBINDEX, TROPONINI in the last 168 hours. BNP (last 3 results) No results for input(s): PROBNP in the last 8760 hours. HbA1C: No results for input(s): HGBA1C in the last 72 hours. CBG: No results for input(s): GLUCAP in the last 168 hours. Lipid Profile: No results for input(s): CHOL, HDL, LDLCALC, TRIG, CHOLHDL, LDLDIRECT in the last 72 hours. Thyroid Function Tests: No results for input(s): TSH, T4TOTAL, FREET4, T3FREE, THYROIDAB in the last 72 hours. Anemia Panel: No results for input(s): VITAMINB12, FOLATE, FERRITIN, TIBC, IRON, RETICCTPCT in the last 72 hours. Urine analysis: No results found for: COLORURINE, APPEARANCEUR, LABSPEC, PHURINE, GLUCOSEU, HGBUR, BILIRUBINUR, KETONESUR, PROTEINUR, UROBILINOGEN, NITRITE, LEUKOCYTESUR Sepsis Labs: No results found for this or any previous visit  (from the past 240 hour(s)).   Radiological Exams on Admission: No results found.  EKG: Independently reviewed.  Normal sinus rhythm at 78 bpm  Assessment/Plan Rectal bleeding: Acute.  Patient presents after having several episodes of bright red blood per rectum at home with blood clots present.  She is on Plavix and aspirin after recent stent in 04/2021.  [facility] have been formally consulted and plan to take patient for EGD for further evaluation. -Admit to medical telemetry bed -Monitor intake and output -N.p.o. for procedure -Serial monitoring of H&H -Transfuse blood products as needed -Appreciate GI consultative services, we will follow-up for any further recommendations  Transient hypotension: Acute.  On admission blood pressures initially noted to be 84/52.  She had received IV fluids with improvement in blood pressures.  Home blood pressure medications include losartan 25 mg daily and metoprolol 25 mg twice daily which she had taken this morning. -Goal MAP greater than 65 -Resume home blood pressure medications possibly tomorrow if blood pressures remain stable  Coronary artery disease: Patient currently denies any complaints of chest pain.  Had recent NSTEMI in 04/2021 requiring stent placement to the RCA.  She was placed on dual antiplatelet therapy following the procedure.  Case discussed with Dr. Radford Pax who recommended continuation of dual antiplatelet therapy given high probability of her in-stent restenosis. -Continue aspirin and Plavix tomorrow  Hyperlipidemia Home medications include atorvastatin 80 mg daily. -Continue home medication regimen  Alcohol use: Patient reports that she drinks 2-3 beers occasionally throughout the week, but not on a daily basis and denies any history of alcohol withdrawals. -Continue to monitor and initiate CIWA protocols if deemed necessary  DVT prophylaxis: SCD Code Status: Full Family Communication: Friend updated at bedside Disposition  Plan: Likely to discharge home Consults called: GI Admission status: Observation, requiring overnight stay for monitoring of hemoglobin  Norval Morton MD [facility]   If 7PM-7AM, please contact night-coverage   06/15/2021, 1:52 PM

## 2021-06-15 NOTE — ED Notes (Signed)
I was informed pt does not have an active bleed noted.

## 2021-06-15 NOTE — Anesthesia Preprocedure Evaluation (Signed)
Anesthesia Evaluation  Patient identified by MRN, date of birth, ID band Patient awake    Reviewed: Patient's Chart, lab work & pertinent test resultsPreop documentation limited or incomplete due to emergent nature of procedure.  Airway Mallampati: II  TM Distance: >3 FB Neck ROM: Full    Dental  (+) Partial Upper   Pulmonary neg pulmonary ROS,    Pulmonary exam normal        Cardiovascular hypertension, Pt. on medications and Pt. on home beta blockers + CAD, + Past MI and + Cardiac Stents (05/22)   Rhythm:Regular Rate:Normal     Neuro/Psych negative neurological ROS  negative psych ROS   GI/Hepatic Neg liver ROS, GIB   Endo/Other  negative endocrine ROS  Renal/GU negative Renal ROS  negative genitourinary   Musculoskeletal negative musculoskeletal ROS (+)   Abdominal (+)  Abdomen: soft. Bowel sounds: normal.  Peds  Hematology negative hematology ROS (+)   Anesthesia Other Findings   Reproductive/Obstetrics                             Anesthesia Physical Anesthesia Plan  ASA: 3 and emergent  Anesthesia Plan: General   Post-op Pain Management:    Induction: Intravenous and Rapid sequence  PONV Risk Score and Plan: 3 and Ondansetron, Dexamethasone, Treatment may vary due to age or medical condition and Midazolam  Airway Management Planned: Mask and Oral ETT  Additional Equipment: None  Intra-op Plan:   Post-operative Plan: Extubation in OR  Informed Consent: I have reviewed the patients History and Physical, chart, labs and discussed the procedure including the risks, benefits and alternatives for the proposed anesthesia with the patient or authorized representative who has indicated his/her understanding and acceptance.     Dental advisory given  Plan Discussed with: CRNA  Anesthesia Plan Comments: (Lab Results      Component                Value               Date                       WBC                      8.0                 06/15/2021                HGB                      12.9                06/15/2021                HCT                      39.9                06/15/2021                MCV                      93.9                06/15/2021                PLT  274                 06/15/2021           Lab Results      Component                Value               Date                      NA                       136                 06/15/2021                K                        4.6                 06/15/2021                CO2                      22                  06/15/2021                GLUCOSE                  124 (H)             06/15/2021                BUN                      18                  06/15/2021                CREATININE               0.97                06/15/2021                CALCIUM                  8.7 (L)             06/15/2021                GFRNONAA                 >60                 06/15/2021                GFRAA                    76                  10/24/2007            ECHO 05/22: 1. Left ventricular ejection fraction, by estimation, is 60 to 65%. The  left ventricle has normal function. The left ventricle has no regional  wall motion abnormalities. Left ventricular diastolic parameters are  consistent with Grade I diastolic  dysfunction (impaired  relaxation).  2. Right ventricular systolic function is normal. The right ventricular  size is normal.  3. The mitral valve is normal in structure. Trivial mitral valve  regurgitation. No evidence of mitral stenosis.  4. The aortic valve is tricuspid. Aortic valve regurgitation is not  visualized. No aortic stenosis is present.  5. The inferior vena cava is normal in size with greater than 50%  respiratory variability, suggesting right atrial pressure of 3 mmHg. )        Anesthesia Quick Evaluation

## 2021-06-15 NOTE — Anesthesia Procedure Notes (Signed)
Procedure Name: Intubation Date/Time: 06/15/2021 3:22 PM Performed by: Dorthea Cove, CRNA Pre-anesthesia Checklist: Patient identified, Emergency Drugs available, Suction available and Patient being monitored Patient Re-evaluated:Patient Re-evaluated prior to induction Oxygen Delivery Method: Circle system utilized Preoxygenation: Pre-oxygenation with 100% oxygen Induction Type: IV induction and Rapid sequence Ventilation: Mask ventilation without difficulty Laryngoscope Size: Mac and 3 Grade View: Grade II Tube type: Oral Tube size: 7.0 mm Number of attempts: 1 Airway Equipment and Method: Stylet and Oral airway Placement Confirmation: ETT inserted through vocal cords under direct vision, positive ETCO2 and breath sounds checked- equal and bilateral Secured at: 21 cm Tube secured with: Tape Dental Injury: Teeth and Oropharynx as per pre-operative assessment

## 2021-06-15 NOTE — ED Provider Notes (Signed)
Emergency Medicine Provider Triage Evaluation Note  Heather Pace , Pace 60 y.o. female  was evaluated in triage.  Pt complains of rectal bleeding.  Multiple episodes of bright red blood within her stool.  She has upper abdominal pain.  This is not radiating to her back.  States she has had Pace colonoscopy previously.  Does not remember the name.  She has not had anything like this previously.  Known thinner however not sure of name. Drank more alcohol last night than "normal"  Review of Systems  Positive: Rectal bleeding, abd pain Negative: Syncope, CP, SON  Physical Exam  BP (!) 169/92 (BP Location: Right Arm)   Pulse 96   Temp 98 F (36.7 C) (Oral)   Resp 16   SpO2 100%  Gen:   Awake,  Diaphoretic Resp:  Normal effort  MSK:   Moves extremities without difficulty Skin:  Pale Other:    Medical Decision Making  Medically screening exam initiated at 11:46 AM.  Appropriate orders placed.  Heather Pace was informed that the remainder of the evaluation will be completed by another provider, this initial triage assessment does not replace that evaluation, and the importance of remaining in the ED until their evaluation is complete.  Here for GI bleed.  Patient was near syncopal episode in triage, checked her blood pressure which was 84 systolic.  Pale, diaphoretic   Labs imaging ordered  Nursing notified patient needs room in back     [place], Rondell Pardon A, PA-C 06/15/21 1149    Heather Shanks, MD 06/24/21 [phone]

## 2021-06-15 NOTE — ED Notes (Signed)
The endo nurse stated pt could be on clear liquid diet. Notified provider to update order.

## 2021-06-15 NOTE — Op Note (Signed)
[facility] Patient Name: Heather Pace Procedure Date : 06/15/2021 MRN: [id number] Attending MD: Gatha Mayer , MD Date of Birth: 05/30/1961 CSN: [id number] Age: 60 Admit Type: Emergency Department Procedure:                Flexible Sigmoidoscopy Indications:              Hematochezia Providers:                Gatha Mayer, MD, Katina Degree, RN, Lesia Sago, Technician Referring MD:              Medicines:                General Anesthesia Complications:            No immediate complications. Estimated Blood Loss:     Estimated blood loss: none. Procedure:                Pre-Anesthesia Assessment:                           - Prior to the procedure, a History and Physical                            was performed, and patient medications and                            allergies were reviewed. The patient's tolerance of                            previous anesthesia was also reviewed. The risks                            and benefits of the procedure and the sedation                            options and risks were discussed with the patient.                            All questions were answered, and informed consent                            was obtained. Prior Anticoagulants: The patient                            last took Plavix (clopidogrel) on the day of the                            procedure. ASA Grade Assessment: III - A patient                            with severe systemic disease. After reviewing the  risks and benefits, the patient was deemed in                            satisfactory condition to undergo the procedure.                           - Prior to the procedure, a History and Physical                            was performed, and patient medications and                            allergies were reviewed. The patient's tolerance of                            previous anesthesia was  also reviewed. The risks                            and benefits of the procedure and the sedation                            options and risks were discussed with the patient.                            All questions were answered, and informed consent                            was obtained. Prior Anticoagulants: The patient                            last took Plavix (clopidogrel) on the day of the                            procedure. ASA Grade Assessment: III - A patient                            with severe systemic disease. After reviewing the                            risks and benefits, the patient was deemed in                            satisfactory condition to undergo the procedure.                           After obtaining informed consent, the scope was                            passed under direct vision. The GIF-H190 ([id number])                            Olympus gastroscope was introduced through the anus  and advanced to the the descending colon. The                            flexible sigmoidoscopy was accomplished without                            difficulty. Scope In: Scope Out: Findings:      The perianal and digital rectal examinations were normal.      Dark red blood was found in the rectum, in the sigmoid colon and in the       descending colon.      May have been a transition to more normal colored stool in descending       colon - but all liquid and difficult to say Impression:               - Blood in the rectum, in the sigmoid colon and in                            the descending colon.                           - No specimens collected. Recommendation:           - Return patient to hospital ward for ongoing care.                           - I think we should hold clopidogrel given                            magnitude of bleeding                           Allow clear liquids                           AT this point I think she  is having a diverticular                            hemorrhage complicated by clopidogrel use which she                            needs s/p DES x 2 5 weeks ago - however acutely                            would hold.                           If she continues to hemorrhage I think we should                            consider platelt transfusion vs CT angio with                            potential IR treatment  Oral PPI qd prophylaxis is orederd                           will follow Procedure Code(s):        --- Professional ---                           [phone], Sigmoidoscopy, flexible; diagnostic,                            including collection of specimen(s) by brushing or                            washing, when performed (separate procedure) Diagnosis Code(s):        --- Professional ---                           K62.5, Hemorrhage of anus and rectum                           K92.2, Gastrointestinal hemorrhage, unspecified                           K92.1, Melena (includes Hematochezia) CPT copyright 2019 American Medical Association. All rights reserved. The codes documented in this report are preliminary and upon coder review may  be revised to meet current compliance requirements. Gatha Mayer, MD 06/15/2021 4:00:03 PM This report has been signed electronically. Number of Addenda: 0

## 2021-06-15 NOTE — Progress Notes (Signed)
Heather Pace is a 60 y.o. female patient admitted. Awake, alert - oriented  X 4 - no acute distress noted.  VSS - Blood pressure 139/64, pulse 85, temperature 98.4 F (36.9 C), resp. rate 14, height 5\' 10"  (1.778 m), weight 102.5 kg, SpO2 98 %.    IV in place, occlusive dsg intact without redness.    Will cont to eval and treat per MD orders.  Vidal Schwalbe, RN 06/15/2021 [phone]

## 2021-06-16 DIAGNOSIS — K5730 Diverticulosis of large intestine without perforation or abscess without bleeding: Secondary | ICD-10-CM | POA: Diagnosis not present

## 2021-06-16 DIAGNOSIS — Z7982 Long term (current) use of aspirin: Secondary | ICD-10-CM | POA: Diagnosis not present

## 2021-06-16 DIAGNOSIS — Z79899 Other long term (current) drug therapy: Secondary | ICD-10-CM | POA: Diagnosis not present

## 2021-06-16 DIAGNOSIS — K625 Hemorrhage of anus and rectum: Secondary | ICD-10-CM | POA: Diagnosis present

## 2021-06-16 DIAGNOSIS — I2510 Atherosclerotic heart disease of native coronary artery without angina pectoris: Secondary | ICD-10-CM | POA: Diagnosis present

## 2021-06-16 DIAGNOSIS — R578 Other shock: Secondary | ICD-10-CM | POA: Diagnosis present

## 2021-06-16 DIAGNOSIS — Z7902 Long term (current) use of antithrombotics/antiplatelets: Secondary | ICD-10-CM | POA: Diagnosis not present

## 2021-06-16 DIAGNOSIS — K5731 Diverticulosis of large intestine without perforation or abscess with bleeding: Secondary | ICD-10-CM | POA: Diagnosis present

## 2021-06-16 DIAGNOSIS — Z20822 Contact with and (suspected) exposure to covid-19: Secondary | ICD-10-CM | POA: Diagnosis present

## 2021-06-16 DIAGNOSIS — Z85828 Personal history of other malignant neoplasm of skin: Secondary | ICD-10-CM | POA: Diagnosis not present

## 2021-06-16 DIAGNOSIS — Z955 Presence of coronary angioplasty implant and graft: Secondary | ICD-10-CM | POA: Diagnosis not present

## 2021-06-16 DIAGNOSIS — I252 Old myocardial infarction: Secondary | ICD-10-CM | POA: Diagnosis not present

## 2021-06-16 DIAGNOSIS — Z8249 Family history of ischemic heart disease and other diseases of the circulatory system: Secondary | ICD-10-CM | POA: Diagnosis not present

## 2021-06-16 DIAGNOSIS — D500 Iron deficiency anemia secondary to blood loss (chronic): Secondary | ICD-10-CM | POA: Diagnosis present

## 2021-06-16 DIAGNOSIS — I10 Essential (primary) hypertension: Secondary | ICD-10-CM | POA: Diagnosis present

## 2021-06-16 DIAGNOSIS — Z888 Allergy status to other drugs, medicaments and biological substances status: Secondary | ICD-10-CM | POA: Diagnosis not present

## 2021-06-16 DIAGNOSIS — E785 Hyperlipidemia, unspecified: Secondary | ICD-10-CM | POA: Diagnosis present

## 2021-06-16 DIAGNOSIS — K644 Residual hemorrhoidal skin tags: Secondary | ICD-10-CM | POA: Diagnosis present

## 2021-06-16 DIAGNOSIS — Z972 Presence of dental prosthetic device (complete) (partial): Secondary | ICD-10-CM | POA: Diagnosis not present

## 2021-06-16 DIAGNOSIS — E861 Hypovolemia: Secondary | ICD-10-CM | POA: Diagnosis present

## 2021-06-16 LAB — CBC
HCT: 33.9 % — ABNORMAL LOW (ref 36.0–46.0)
Hemoglobin: 11 g/dL — ABNORMAL LOW (ref 12.0–15.0)
MCH: 30.5 pg (ref 26.0–34.0)
MCHC: 32.4 g/dL (ref 30.0–36.0)
MCV: 93.9 fL (ref 80.0–100.0)
Platelets: 225 10*3/uL (ref 150–400)
RBC: 3.61 MIL/uL — ABNORMAL LOW (ref 3.87–5.11)
RDW: 13.2 % (ref 11.5–15.5)
WBC: 10.8 10*3/uL — ABNORMAL HIGH (ref 4.0–10.5)
nRBC: 0 % (ref 0.0–0.2)

## 2021-06-16 LAB — BASIC METABOLIC PANEL
Anion gap: 9 (ref 5–15)
BUN: 12 mg/dL (ref 8–23)
CO2: 21 mmol/L — ABNORMAL LOW (ref 22–32)
Calcium: 8.5 mg/dL — ABNORMAL LOW (ref 8.9–10.3)
Chloride: 106 mmol/L (ref 98–111)
Creatinine, Ser: 0.89 mg/dL (ref 0.44–1.00)
GFR, Estimated: >60 mL/min (ref >60)
Glucose, Bld: 178 mg/dL — ABNORMAL HIGH (ref 70–99)
Potassium: 3.8 mmol/L (ref 3.5–5.1)
Sodium: 136 mmol/L (ref 135–145)

## 2021-06-16 LAB — HEMOGLOBIN AND HEMATOCRIT, BLOOD
HCT: 34.5 % — ABNORMAL LOW (ref 36.0–46.0)
HCT: 31.7 % — ABNORMAL LOW (ref 36.0–46.0)
Hemoglobin: 11.4 g/dL — ABNORMAL LOW (ref 12.0–15.0)
Hemoglobin: 10.3 g/dL — ABNORMAL LOW (ref 12.0–15.0)

## 2021-06-16 NOTE — Anesthesia Postprocedure Evaluation (Signed)
Anesthesia Post Note  Patient: Heather Pace  Procedure(s) Performed: ESOPHAGOGASTRODUODENOSCOPY (EGD) WITH PROPOFOL FLEXIBLE SIGMOIDOSCOPY     Patient location during evaluation: Endoscopy Anesthesia Type: General Level of consciousness: awake and alert Pain management: pain level controlled Vital Signs Assessment: post-procedure vital signs reviewed and stable Respiratory status: spontaneous breathing, nonlabored ventilation, respiratory function stable and patient connected to nasal cannula oxygen Cardiovascular status: blood pressure returned to baseline and stable Postop Assessment: no apparent nausea or vomiting Anesthetic complications: no   No notable events documented.  Last Vitals:  Vitals:   06/16/21 0235 06/16/21 0616  BP: 136/63 140/72  Pulse: 76 81  Resp: 16 16  Temp: 36.7 C 36.7 C  SpO2: 97% 99%    Last Pain:  Vitals:   06/16/21 0830  TempSrc:   PainSc: 0-No pain                 Belenda Cruise P Paula Busenbark

## 2021-06-16 NOTE — Progress Notes (Addendum)
Patient ID: Heather Pace, female   DOB: 1961-08-13, 60 y.o.   MRN: [id number]    Progress Note   Subjective  Day # 2   CC; GI bleed in setting of aspirin and Plavix  EGD - negative  Last hemoglobin 11.0/hematocrit 33.9 (down from 12.9 on arrival)  Pt feels ok, no c/o pain, last BM was last evening - small volume of bloody stool   Objective   Vital signs in last 24 hours: Temp:  [97.4 F (36.3 C)-98.4 F (36.9 C)] 98 F (36.7 C) (06/21 0616) Pulse Rate:  [72-96] 81 (06/21 0616) Resp:  [10-19] 16 (06/21 0616) BP: (84-169)/(52-92) 140/72 (06/21 0616) SpO2:  [95 %-100 %] 99 % (06/21 0616) Weight:  [102.5 kg] 102.5 kg (06/20 1500) Last BM Date: 06/15/21 General:   Older white female in NAD Heart:  Regular rate and rhythm; no murmurs Lungs: Respirations even and unlabored, lungs CTA bilaterally Abdomen:  Soft, nontender and nondistended. Normal bowel sounds. Extremities:  Without edema. Neurologic:  Alert and oriented,  grossly normal neurologically. Psych:  Cooperative. Normal mood and affect.  Intake/Output from previous day: 06/20 0701 - 06/21 0700 In: 120 [P.O.:120] Out: -  Intake/Output this shift: No intake/output data recorded.  Lab Results: Recent Labs    06/15/21 1155 06/15/21 1800 06/15/21 2049 06/16/21 0102  WBC 8.0  --   --  10.8*  HGB 12.9 11.7* 11.6* 11.0*  HCT 39.9 35.8* 35.0* 33.9*  PLT 274  --   --  225   BMET Recent Labs    06/15/21 1155 06/16/21 0102  NA 136 136  K 4.6 3.8  CL 107 106  CO2 22 21*  GLUCOSE 124* 178*  BUN 18 12  CREATININE 0.97 0.89  CALCIUM 8.7* 8.5*   LFT Recent Labs    06/15/21 1155  PROT 6.3*  ALBUMIN 3.7  AST 28  ALT 19  ALKPHOS 68  BILITOT 1.1   PT/INR Recent Labs    06/15/21 1328  LABPROT 13.6  INR 1.0        Assessment / Plan:    #30  60 year old female admitted yesterday with acute GI bleed in setting of Plavix and aspirin. EGD was done to rule out upper source and this was  negative Flexible sigmoidoscopy with finding of bloody stool  Patient has previously documented pandiverticulosis from colonoscopy January 2019, Bleeding is consistent with a diverticular hemorrhage   Bleeding has slowed significantly overnight, no bowel movement over the past 16 hours  #2 anemia secondary to above #3 recent MRI 05/06/2021 status post overlapping stents to the RCA and started on aspirin and Plavix   Plan; continue close observation, serial hemoglobins and transfuse as indicated Full liquid diet today Restart baby aspirin today Asked nurse to hold the Plavix this morning, will discuss with Dr. Carlean Purl, she is only been off Plavix for 24 hours may hold for 1 more day then resume in a.m.  Patient has recurrent active bleeding would proceed with CT angio and possible embolization.     LOS: 0 days   Amy Esterwood PA-C 06/16/2021, 10:09 AM  I have also seen and evaluated the patient.  I agree with Ms. Genia Harold note.  The patient is slowly improving and needs continued care in the hospital.  I generally like to see 24 to 48 hours without bleeding prior to discharge.  Gatha Mayer, MD, [facility] 06/16/2021 4:06 PM

## 2021-06-16 NOTE — Progress Notes (Addendum)
PROGRESS NOTE    Heather Pace  BSJ:[id number] DOB: [birth year] DOA: 06/15/2021 PCP: [name], MD   Brief Narrative:  This 60 years old female with PMH significant for hyperlipidemia, CAD and non-STEMI s/p stents to the proximal to mid RCA in May 2022 presents with complaints of rectal bleeding, starting this morning around 5 AM.  Patient has so far multiple episodes of bright red blood per rectum associated with diaphoresis.  She has been taking aspirin and Plavix as prescribed following NSTEMI.  She was hypotensive on arrival in the ED,  blood pressure has improved with IV fluid resuscitation. GI consulted patient underwent EGD and sigmoidoscopy. EGD was unremarkable sigmoidoscopy showed diverticular bleed likely secondary to the Plavix.  GI recommended to hold Plavix.  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 She is on Plavix and aspirin after recent stenting ON 5/22 Hemoglobin remained stable.  GI was consulted,  patient underwent EGD and sigmoidoscopy. Monitor H&H q12 hr EGD unremarkable, sigmoidoscopy shows diverticular bleed secondary to Plavix. GI thinks she is having  diverticular hemorrhage complicated by clopidogrel use which she needs s/p DES x 2  5 weeks ago - however acutely would hold. If she continues to hemorrhage we should consider platelt transfusion vs CT angio with potential IR treatment.   Coronary artery disease:  Patient currently denies any complaints of chest pain.   Had recent NSTEMI in 04/2021 requiring stent placement to the RCA.   She was placed on dual antiplatelet therapy following the procedure.   Case discussed with Dr. Radford Pax who  recommended continuation of dual antiplatelet therapy given high probability of her in-stent restenosis. Continue aspirin and hold Plavix for now as per GI.   Hyperlipidemia : Continue atorvastatin.     Alcohol use:  Patient reports that she drinks 2-3 beers occasionally throughout the week, but not on a daily basis and denies any history of alcohol withdrawals. Continue to monitor and initiate CIWA protocols if deemed necessary    DVT prophylaxis: SCD Code Status:  Full code. Family Communication: Daughter at bedside  disposition Plan:  Status is: Inpatient  Remains inpatient appropriate because:Inpatient level of care appropriate due to severity of illness  Dispo: The patient is from: Home              Anticipated d/c is to: Home              Patient currently is not medically stable to d/c.   Difficult to place patient No  Consultants:  GI  Procedures: EGD and sigmoidoscopy Antimicrobials:   Anti-infectives (From admission, onward)    None        Subjective: Patient was seen and examined at bedside.  Overnight events noted.   She reports feeling better after EGD and sigmoidoscopy.  Denies any further bleeding.  Objective: Vitals:   06/15/21 1913 06/16/21 0235 06/16/21 0616 06/16/21 1417  BP: 139/64 136/63 140/72 (!) 143/66  Pulse: 85 76 81 85  Resp:  16 16 16   Temp: 98.4 F (36.9 C) 98 F (36.7 C) 98 F (36.7 C) 98.3 F (36.8 C)  TempSrc: Oral Oral Oral Oral  SpO2: 98% 97% 99% 100%  Weight:  Height:        Intake/Output Summary (Last 24 hours) at 06/16/2021 1535 Last data filed at 06/16/2021 0900 Gross per 24 hour  Intake 480 ml  Output --  Net 480 ml   Filed Weights   06/15/21 1500  Weight: 102.5 kg    Examination:  General exam: Appears calm and comfortable, not in any acute distress. Respiratory system: Clear to auscultation. Respiratory effort normal. Cardiovascular system: S1 & S2 heard, RRR. No JVD, murmurs, rubs, gallops or  clicks. No pedal edema. Gastrointestinal system: Abdomen is nondistended, soft and nontender. No organomegaly or masses felt. Normal bowel sounds heard. Central nervous system: Alert and oriented. No focal neurological deficits. Extremities: Symmetric 5 x 5 power. Skin: No rashes, lesions or ulcers Psychiatry: Judgement and insight appear normal. Mood & affect appropriate.     Data Reviewed: I have personally reviewed following labs and imaging studies  CBC: Recent Labs  Lab 06/15/21 1155 06/15/21 1800 06/15/21 2049 06/16/21 0102 06/16/21 1059  WBC 8.0  --   --  10.8*  --   NEUTROABS 4.9  --   --   --   --   HGB 12.9 11.7* 11.6* 11.0* 11.4*  HCT 39.9 35.8* 35.0* 33.9* 34.5*  MCV 93.9  --   --  93.9  --   PLT 274  --   --  225  --    Basic Metabolic Panel: Recent Labs  Lab 06/15/21 1155 06/16/21 0102  NA 136 136  K 4.6 3.8  CL 107 106  CO2 22 21*  GLUCOSE 124* 178*  BUN 18 12  CREATININE 0.97 0.89  CALCIUM 8.7* 8.5*   GFR: Estimated Creatinine Clearance: 86 mL/min (by C-G formula based on SCr of 0.89 mg/dL). Liver Function Tests: Recent Labs  Lab 06/15/21 1155  AST 28  ALT 19  ALKPHOS 68  BILITOT 1.1  PROT 6.3*  ALBUMIN 3.7   No results for input(s): LIPASE, AMYLASE in the last 168 hours. No results for input(s): AMMONIA in the last 168 hours. Coagulation Profile: Recent Labs  Lab 06/15/21 1328  INR 1.0   Cardiac Enzymes: No results for input(s): CKTOTAL, CKMB, CKMBINDEX, TROPONINI in the last 168 hours. BNP (last 3 results) No results for input(s): PROBNP in the last 8760 hours. HbA1C: No results for input(s): HGBA1C in the last 72 hours. CBG: No results for input(s): GLUCAP in the last 168 hours. Lipid Profile: No results for input(s): CHOL, HDL, LDLCALC, TRIG, CHOLHDL, LDLDIRECT in the last 72 hours. Thyroid Function Tests: No results for input(s): TSH, T4TOTAL, FREET4, T3FREE, THYROIDAB in the last 72 hours. Anemia Panel: No results for  input(s): VITAMINB12, FOLATE, FERRITIN, TIBC, IRON, RETICCTPCT in the last 72 hours. Sepsis Labs: No results for input(s): PROCALCITON, LATICACIDVEN in the last 168 hours.  Recent Results (from the past 240 hour(s))  Resp Panel by RT-PCR (Flu A&B, Covid) Nasopharyngeal Swab     Status: None   Collection Time: 06/15/21 12:54 PM   Specimen: Nasopharyngeal Swab; Nasopharyngeal(NP) swabs in vial transport medium  Result Value Ref Range Status   SARS Coronavirus 2 by RT PCR NEGATIVE NEGATIVE Final    Comment: (NOTE) SARS-CoV-2 target nucleic acids are NOT DETECTED.  The SARS-CoV-2 RNA is generally detectable in upper respiratory specimens during the acute phase of infection. The lowest concentration of SARS-CoV-2 viral copies this assay can detect is 138 copies/mL. A negative result does not preclude SARS-Cov-2 infection and should not be used as the sole basis for  treatment or other patient management decisions. A negative result may occur with  improper specimen collection/handling, submission of specimen other than nasopharyngeal swab, presence of viral mutation(s) within the areas targeted by this assay, and inadequate number of viral copies(<138 copies/mL). A negative result must be combined with clinical observations, patient history, and epidemiological information. The expected result is Negative.  Fact Sheet for Patients:  EntrepreneurPulse.com.au  Fact Sheet for Healthcare Providers:  IncredibleEmployment.be  This test is no t yet approved or cleared by the [place] FDA and  has been authorized for detection and/or diagnosis of SARS-CoV-2 by FDA under an Emergency Use Authorization (EUA). This EUA will remain  in effect (meaning this test can be used) for the duration of the COVID-19 declaration under Section 564(b)(1) of the Act, 21 U.S.C.section 360bbb-3(b)(1), unless the authorization is terminated  or revoked sooner.        Influenza A by PCR NEGATIVE NEGATIVE Final   Influenza B by PCR NEGATIVE NEGATIVE Final    Comment: (NOTE) The Xpert Xpress SARS-CoV-2/FLU/RSV plus assay is intended as an aid in the diagnosis of influenza from Nasopharyngeal swab specimens and should not be used as a sole basis for treatment. Nasal washings and aspirates are unacceptable for Xpert Xpress SARS-CoV-2/FLU/RSV testing.  Fact Sheet for Patients: EntrepreneurPulse.com.au  Fact Sheet for Healthcare Providers: IncredibleEmployment.be  This test is not yet approved or cleared by the [place] FDA and has been authorized for detection and/or diagnosis of SARS-CoV-2 by FDA under an Emergency Use Authorization (EUA). This EUA will remain in effect (meaning this test can be used) for the duration of the COVID-19 declaration under Section 564(b)(1) of the Act, 21 U.S.C. section 360bbb-3(b)(1), unless the authorization is terminated or revoked.  Performed at [facility], [place] [address] [place], [place] [address]     Radiology Studies: No results found.  Scheduled Meds:  aspirin  81 mg Oral Daily   atorvastatin  80 mg Oral Daily   clopidogrel  75 mg Oral Q breakfast   ezetimibe  10 mg Oral Daily   pantoprazole  40 mg Oral QAC breakfast   sodium chloride flush  3 mL Intravenous Q12H   Continuous Infusions:  calcium gluconate       LOS: 0 days    Time spent: 35 mins    Devani Odonnel, MD [facility]   If 7PM-7AM, please contact night-coverage

## 2021-06-17 ENCOUNTER — Encounter (HOSPITAL_COMMUNITY): Payer: Self-pay | Admitting: Internal Medicine

## 2021-06-17 DIAGNOSIS — K5730 Diverticulosis of large intestine without perforation or abscess without bleeding: Secondary | ICD-10-CM

## 2021-06-17 LAB — HEMOGLOBIN AND HEMATOCRIT, BLOOD
HCT: 32.1 % — ABNORMAL LOW (ref 36.0–46.0)
HCT: 31.5 % — ABNORMAL LOW (ref 36.0–46.0)
Hemoglobin: 10.5 g/dL — ABNORMAL LOW (ref 12.0–15.0)
Hemoglobin: 10.3 g/dL — ABNORMAL LOW (ref 12.0–15.0)

## 2021-06-17 LAB — MAGNESIUM: Magnesium: 2.3 mg/dL (ref 1.7–2.4)

## 2021-06-17 LAB — CBC
HCT: 29.4 % — ABNORMAL LOW (ref 36.0–46.0)
Hemoglobin: 9.7 g/dL — ABNORMAL LOW (ref 12.0–15.0)
MCH: 31 pg (ref 26.0–34.0)
MCHC: 33 g/dL (ref 30.0–36.0)
MCV: 93.9 fL (ref 80.0–100.0)
Platelets: 201 10*3/uL (ref 150–400)
RBC: 3.13 MIL/uL — ABNORMAL LOW (ref 3.87–5.11)
RDW: 13.4 % (ref 11.5–15.5)
WBC: 11.5 10*3/uL — ABNORMAL HIGH (ref 4.0–10.5)
nRBC: 0 % (ref 0.0–0.2)

## 2021-06-17 LAB — BASIC METABOLIC PANEL
Anion gap: 6 (ref 5–15)
BUN: 10 mg/dL (ref 8–23)
CO2: 26 mmol/L (ref 22–32)
Calcium: 8.2 mg/dL — ABNORMAL LOW (ref 8.9–10.3)
Chloride: 106 mmol/L (ref 98–111)
Creatinine, Ser: 0.86 mg/dL (ref 0.44–1.00)
GFR, Estimated: >60 mL/min (ref >60)
Glucose, Bld: 115 mg/dL — ABNORMAL HIGH (ref 70–99)
Potassium: 3.9 mmol/L (ref 3.5–5.1)
Sodium: 138 mmol/L (ref 135–145)

## 2021-06-17 LAB — PHOSPHORUS: Phosphorus: 2.6 mg/dL (ref 2.5–4.6)

## 2021-06-17 MED ORDER — METOPROLOL TARTRATE 12.5 MG HALF TABLET
12.5000 mg | ORAL_TABLET | Freq: Two times a day (BID) | ORAL | Status: DC
  Administered 2021-06-17 – 2021-06-18 (×3): 12.5 mg via ORAL
  Filled 2021-06-17 (×3): qty 1

## 2021-06-17 NOTE — Hospital Course (Addendum)
48 white female community dwelling Known history pandiverticulosis without polyps 12/2017 Hospitalized 5/10->5/12 with ACS-left heart cath = 99% RCA occlusion-placed on DAPT ASA/Plavix and other goal-directed medical therapy per cardiology on discharge Re-presented 6/20 copious BRB + clots with + diaphoresis + presyncope-also epigastric tenderness VS = BP 80/50, hemoglobin 12 NS 2 L given in ED  Cardiology Dr. Radford Pax consulted-recommended continuation DAPT given high risk for in-stent stenosis GI saw the patient in consult held Plavix 6/21 with aim to resume 6/22- 6/21-colonoscopy = blood in rectum sigmoid descending colon 6/21-endoscopy normal esophagus normal stomach no acute blood seen

## 2021-06-17 NOTE — Progress Notes (Signed)
PROGRESS NOTE   Heather Pace  AJO:[id number] DOB: [birth year] DOA: 06/15/2021 PCP: [name], MD  Brief Narrative:  47 white female community dwelling Known history pandiverticulosis without polyps 12/2017 Hospitalized 5/10->5/12 with ACS-left heart cath = 99% RCA occlusion-placed on DAPT ASA/Plavix and other goal-directed medical therapy per cardiology on discharge Re-presented 6/20 copious BRB + clots with + diaphoresis + presyncope-also epigastric tenderness VS = BP 80/50, hemoglobin 12 NS 2 L given in ED  Cardiology Dr. Radford Pax consulted-recommended continuation DAPT given high risk for in-stent stenosis GI saw the patient in consult held Plavix 6/21 with aim to resume 6/22-    Hospital-Problem based course  Probable diverticular bleed Only a small smear of blood today-advancing diet from liquid to soft and defer to GI at next steps Has been okayed to use aspirin/Plavix from 6/22-monitor for clinical large amounts of bleeding-otherwise if stable possible discharge in 24 to 48 hours as per GI NSTEMI 04/2021 status post DES RCA Continue goal-directed therapy including aspirin Plavix as per Dr. Radford Pax (risk of renal in-stent stenosis) Continue Zetia 10, atorvastatin 80 Resume metoprolol 25-->12.5 twice daily 6/22 Losartan to be resumed as outpatient ?  PCK inhibitor discussion in outpatient setting as per cardiology  DVT prophylaxis: SCD Code Status: FULL Family Communication: None Disposition:  Status is: Inpatient  Remains inpatient appropriate because:Hemodynamically unstable, Unsafe d/c plan, and IV treatments appropriate due to intensity of illness or inability to take PO  Dispo: The patient is from: Home              Anticipated d/c is to: Home              Patient currently is not medically stable to d/c.   Difficult to place patient No     Consultants:  Gastroenterology  Procedures:   6/21-colonoscopy = blood in rectum sigmoid descending  colon 6/21-endoscopy normal esophagus normal stomach no acute blood seen  Antimicrobials: none    Subjective:  Wishes to eat No distress No stool since admission-no abdominal pain no chest pain no fever no chills Tolerated liquids overnight--we graduated her diet to soft today  Objective: Vitals:   06/16/21 1417 06/16/21 2122 06/17/21 0532 06/17/21 0800  BP: (!) 143/66 124/64 119/70 130/68  Pulse: 85 75 72 75  Resp: 16 16 15 18   Temp: 98.3 F (36.8 C) 97.7 F (36.5 C) 97.7 F (36.5 C) 97.7 F (36.5 C)  TempSrc: Oral  Oral Oral  SpO2: 100% 100% 99% 100%  Weight:      Height:        Intake/Output Summary (Last 24 hours) at 06/17/2021 1145 Last data filed at 06/16/2021 1700 Gross per 24 hour  Intake 240 ml  Output --  Net 240 ml   Filed Weights   06/15/21 1500  Weight: 102.5 kg    Examination:  Awake coherent thick neck moderate dentition no JVD S1-S2?  Holosystolic murmur Abdomen soft no rebound no guarding cannot appreciate any organomegaly secondary to habitus Chest clear   Data Reviewed: personally reviewed   Hemoglobin 11.0-->9.7 BUN/creatinine 12/0.8-- 10/0.8 WBC 11.5 Platelet 201  Radiology Studies: No results found.   Scheduled Meds:  aspirin  81 mg Oral Daily   atorvastatin  80 mg Oral Daily   clopidogrel  75 mg Oral Q breakfast   ezetimibe  10 mg Oral Daily   pantoprazole  40 mg Oral QAC breakfast   sodium chloride flush  3 mL Intravenous Q12H   Continuous Infusions:  calcium gluconate  LOS: 1 day   Time spent: [place], MD [facility] To contact the attending provider between 7A-7P or the covering provider during after hours 7P-7A, please log into the web site [email or web address] and access using universal [place] password for that web site. If you do not have the password, please call the hospital operator.  06/17/2021, 11:45 AM

## 2021-06-17 NOTE — Progress Notes (Signed)
[facility] Progress Note  CC:  Acute GI bleed   Subjective:  She tolerated a full liquid diet for dinner last evening and for breakfast this morning. She wishes to advance her diet. Mild upper abdominal discomfort, no severe pain. She wiped a small amount of red blood from the rectum when bathing this morning otherwise no further hematochezia or passage of blood clots from the rectum. No N/V. No CP or SOB.    Objective:   EGD 06/15/2021: Normal esophagus. - Normal stomach. Slight food retention but irrigated and suctioned - Normal examined duodenum. - No specimens collected. - NO CAUSE GI BLEEDING SEEN - NO BLOOD SEEN AT ALL  Flexible sigmoidoscopy 06/15/2021: - Blood in the rectum, in the sigmoid colon and in the descending colon. - No specimens collected  Vital signs in last 24 hours: Temp:  [97.7 F (36.5 C)-98.3 F (36.8 C)] 97.7 F (36.5 C) (06/22 0800) Pulse Rate:  [72-85] 75 (06/22 0800) Resp:  [15-18] 18 (06/22 0800) BP: (119-143)/(64-70) 130/68 (06/22 0800) SpO2:  [99 %-100 %] 100 % (06/22 0800) Last BM Date: 06/15/21 General: Alert 60 year old female in no acute distress. Heart: Regular rate and rhythm, no murmurs. Pulm: Breath sounds clear throughout Abdomen: Soft, nondistended.  Nontender.  Positive bowel sounds all 4 quadrants. Extremities:  Without edema. Neurologic:  Alert and  oriented x4. Grossly normal neurologically. Psych:  Alert and cooperative. Normal mood and affect.  Intake/Output from previous day: 06/21 0701 - 06/22 0700 In: 600 [P.O.:600] Out: -  Intake/Output this shift: No intake/output data recorded.  Lab Results: Recent Labs    06/15/21 1155 06/15/21 1800 06/16/21 0102 06/16/21 1059 06/16/21 1803 06/17/21 0236  WBC 8.0  --  10.8*  --   --  11.5*  HGB 12.9   < > 11.0* 11.4* 10.3* 9.7*  HCT 39.9   < > 33.9* 34.5* 31.7* 29.4*  PLT 274  --  225  --   --  201   < > = values in this interval not displayed.    BMET Recent Labs    06/15/21 1155 06/16/21 0102 06/17/21 0236  NA 136 136 138  K 4.6 3.8 3.9  CL 107 106 106  CO2 22 21* 26  GLUCOSE 124* 178* 115*  BUN 18 12 10   CREATININE 0.97 0.89 0.86  CALCIUM 8.7* 8.5* 8.2*   LFT Recent Labs    06/15/21 1155  PROT 6.3*  ALBUMIN 3.7  AST 28  ALT 19  ALKPHOS 68  BILITOT 1.1   PT/INR Recent Labs    06/15/21 1328  LABPROT 13.6  INR 1.0   Hepatitis Panel No results for input(s): HEPBSAG, HCVAB, HEPAIGM, HEPBIGM in the last 72 hours.  No results found.  Assessment / Plan:  68. 60 year old female with a history of diverticulosis s/p MI with 99% RCA lesion at cath with overlapping stents placed and started on Plavix and aspirin. Admitted to the hospital with acute GI bleed, passed dark red blood with clots with upper abdominal pain on 06/16/2021. Admission Hg 12.9 -> 11.4 -> 10.3 -> Today 9.7 -> 10.5 at 11AM. S/P EGD 6/21 was normal. Flexible sigmoidoscopy showed blood in there rectum, sigmoid and descending colon, likely diverticular bleed.  No further significant hematochezia, she wiped a small amount of blood on the washcloth this morning when bathing.  She remains hemodynamically stable.  Plavix and ASA were restarted at 8:30 AM today. -Advance to soft diet -Continue PPI po QD -  Monitor H&H closely -Transfuse for hemoglobin < 8 -CT angiogram/IR intervention if patient re-bleeds -Further recommendations per Dr. 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

## 2021-06-18 ENCOUNTER — Other Ambulatory Visit: Payer: Self-pay | Admitting: Internal Medicine

## 2021-06-18 DIAGNOSIS — K922 Gastrointestinal hemorrhage, unspecified: Secondary | ICD-10-CM

## 2021-06-18 LAB — HEMOGLOBIN AND HEMATOCRIT, BLOOD
HCT: 29.8 % — ABNORMAL LOW (ref 36.0–46.0)
Hemoglobin: 9.6 g/dL — ABNORMAL LOW (ref 12.0–15.0)

## 2021-06-18 MED ORDER — PANTOPRAZOLE SODIUM 40 MG PO TBEC: 40.0000 mg | DELAYED_RELEASE_TABLET | Freq: Every day | ORAL | 0 refills | Status: AC

## 2021-06-18 NOTE — Discharge Summary (Signed)
Physician Discharge Summary  Heather Pace GGE:[id number] DOB: March 29, 1961 DOA: 06/15/2021  PCP: [name], MD  Admit date: 06/15/2021 Discharge date: 06/18/2021  Time spent: 27 minutes  Recommendations for Outpatient Follow-up:  Require CBC, Chem-12 in 1 week in outpatient setting Consider resumption of losartan outpatient setting at that time based on renal function Needs outpatient follow-up Dr. Arelia Longest looks like it is scheduled for 6/27 Requires TOC visit with cardiology in the outpatient setting--consider lipidology input with regards to advanced therapies  Discharge Diagnoses:  MAIN problem for hospitalization   acute GI bleed--probably diverticular    Please see below for itemized issues addressed in [place]- refer to other progress notes for clarity if needed  Discharge Condition: Improved  Diet recommendation: Heart healthy  Filed Weights   06/15/21 1500  Weight: 102.5 kg    History of present illness:  23 white female community dwelling Known history pandiverticulosis without polyps 12/2017 Hospitalized 5/10->5/12 with ACS-left heart cath = 99% RCA occlusion-placed on DAPT ASA/Plavix and other goal-directed medical therapy per cardiology on discharge Re-presented 6/20 copious BRB + clots with + diaphoresis + presyncope-also epigastric tenderness VS = BP 80/50, hemoglobin 12 NS 2 L given in ED   Cardiology Dr. Radford Pax consulted-recommended continuation DAPT given high risk for in-stent stenosis GI saw the patient in consult held Plavix 6/21 with aim to resume 6/22[facility] Course:   Hypovolemic/distributive shock on admission secondary to blood loss anemia Resolved Probable diverticular bleed GI saw the patient and performed endoscopy colonoscopy as below Cautiously graduated up to tolerated soft diet and cleared for d/c with close f/u 6/27 Cont aspirin/Plavix from 6/22-needs OP cbc I week post-discharge NSTEMI 04/2021 status post DES RCA Continue  goal-directed therapy including aspirin Plavix as per Dr. Radford Pax (risk of re in-stent stenosis) Continue Zetia 10, atorvastatin 80 Resume metoprolol 25-->12.5 twice daily 6/22 and resumed on discharge higher dose of 25 mg Losartan to be resumed as outpatient ?  PCK inhibitor discussion in outpatient setting as per cardiology   Consultants:  Gastroenterology By telephone Dr. Radford Pax cardiology consulted   Procedures:    6/21-colonoscopy = blood in rectum sigmoid descending colon 6/21-endoscopy normal esophagus normal stomach no acute blood seen   Discharge Exam: Vitals:   06/17/21 2006 06/18/21 0458  BP: (!) 132/59 (!) 110/59  Pulse: 76 76  Resp: 17 17  Temp: 97.9 F (36.6 C) 98 F (36.7 C)  SpO2: 100% 100%    Subj on day of d/c   Pleasant sitting up in the bed no distress tolerated serial banana oatmeal this morning no abdominal pain no dark stool no tarry stool no fever no chills no nausea no vomiting  General Exam on discharge  Awake pleasant alert thick neck Mallampati 4 no JVD moderate dentition S1-S2 sinus rhythm Abdomen soft no epigastric tenderness no rebound no guarding cannot appreciate organomegaly No lower extremity edema Psych euthymic pleasant  Discharge Instructions    Allergies as of 06/18/2021       Reactions   Atorvastatin Other (See Comments)   Myalgias    Bupropion    Rosuvastatin         Medication List     STOP taking these medications    losartan 25 MG tablet Commonly known as: COZAAR       TAKE these medications    aspirin 81 MG chewable tablet Chew 1 tablet (81 mg total) by mouth daily.   atorvastatin 80 MG tablet Commonly known as: LIPITOR Take 1 tablet (  80 mg total) by mouth daily.   clopidogrel 75 MG tablet Commonly known as: PLAVIX Take 1 tablet (75 mg total) by mouth daily with breakfast.   ezetimibe 10 MG tablet Commonly known as: ZETIA Take 10 mg by mouth daily.   metoprolol tartrate 25 MG tablet Commonly  known as: LOPRESSOR Take 1 tablet (25 mg total) by mouth 2 (two) times daily.   nitroGLYCERIN 0.4 MG SL tablet Commonly known as: NITROSTAT Place 1 tablet (0.4 mg total) under the tongue every 5 (five) minutes x 3 doses as needed for chest pain.   pantoprazole 40 MG tablet Commonly known as: PROTONIX Take 1 tablet (40 mg total) by mouth daily before breakfast. Start taking on: June 19, 2021       Allergies  Allergen Reactions   Atorvastatin Other (See Comments)    Myalgias    Bupropion    Rosuvastatin       The results of significant diagnostics from this hospitalization (including imaging, microbiology, ancillary and laboratory) are listed below for reference.    Significant Diagnostic Studies: No results found.  Microbiology: Recent Results (from the past 240 hour(s))  Resp Panel by RT-PCR (Flu A&B, Covid) Nasopharyngeal Swab     Status: None   Collection Time: 06/15/21 12:54 PM   Specimen: Nasopharyngeal Swab; Nasopharyngeal(NP) swabs in vial transport medium  Result Value Ref Range Status   SARS Coronavirus 2 by RT PCR NEGATIVE NEGATIVE Final    Comment: (NOTE) SARS-CoV-2 target nucleic acids are NOT DETECTED.  The SARS-CoV-2 RNA is generally detectable in upper respiratory specimens during the acute phase of infection. The lowest concentration of SARS-CoV-2 viral copies this assay can detect is 138 copies/mL. A negative result does not preclude SARS-Cov-2 infection and should not be used as the sole basis for treatment or other patient management decisions. A negative result may occur with  improper specimen collection/handling, submission of specimen other than nasopharyngeal swab, presence of viral mutation(s) within the areas targeted by this assay, and inadequate number of viral copies(<138 copies/mL). A negative result must be combined with clinical observations, patient history, and epidemiological information. The expected result is Negative.  Fact  Sheet for Patients:  EntrepreneurPulse.com.au  Fact Sheet for Healthcare Providers:  IncredibleEmployment.be  This test is no t yet approved or cleared by the [place] FDA and  has been authorized for detection and/or diagnosis of SARS-CoV-2 by FDA under an Emergency Use Authorization (EUA). This EUA will remain  in effect (meaning this test can be used) for the duration of the COVID-19 declaration under Section 564(b)(1) of the Act, 21 U.S.C.section 360bbb-3(b)(1), unless the authorization is terminated  or revoked sooner.       Influenza A by PCR NEGATIVE NEGATIVE Final   Influenza B by PCR NEGATIVE NEGATIVE Final    Comment: (NOTE) The Xpert Xpress SARS-CoV-2/FLU/RSV plus assay is intended as an aid in the diagnosis of influenza from Nasopharyngeal swab specimens and should not be used as a sole basis for treatment. Nasal washings and aspirates are unacceptable for Xpert Xpress SARS-CoV-2/FLU/RSV testing.  Fact Sheet for Patients: EntrepreneurPulse.com.au  Fact Sheet for Healthcare Providers: IncredibleEmployment.be  This test is not yet approved or cleared by the [place] FDA and has been authorized for detection and/or diagnosis of SARS-CoV-2 by FDA under an Emergency Use Authorization (EUA). This EUA will remain in effect (meaning this test can be used) for the duration of the COVID-19 declaration under Section 564(b)(1) of the Act, 21 U.S.C. section 360bbb-3(b)(1),  unless the authorization is terminated or revoked.  Performed at [facility], [place] [address], [place], [place] [address]      Labs: Basic Metabolic Panel: Recent Labs  Lab 06/15/21 1155 06/16/21 0102 06/17/21 0236  NA 136 136 138  K 4.6 3.8 3.9  CL 107 106 106  CO2 22 21* 26  GLUCOSE 124* 178* 115*  BUN 18 12 10   CREATININE 0.97 0.89 0.86  CALCIUM 8.7* 8.5* 8.2*  MG  --   --  2.3  PHOS  --   --  2.6    Liver Function Tests: Recent Labs  Lab 06/15/21 1155  AST 28  ALT 19  ALKPHOS 68  BILITOT 1.1  PROT 6.3*  ALBUMIN 3.7   No results for input(s): LIPASE, AMYLASE in the last 168 hours. No results for input(s): AMMONIA in the last 168 hours. CBC: Recent Labs  Lab 06/15/21 1155 06/15/21 1800 06/16/21 0102 06/16/21 1059 06/16/21 1803 06/17/21 0236 06/17/21 1059 06/17/21 1824 06/18/21 0249  WBC 8.0  --  10.8*  --   --  11.5*  --   --   --   NEUTROABS 4.9  --   --   --   --   --   --   --   --   HGB 12.9   < > 11.0*   < > 10.3* 9.7* 10.5* 10.3* 9.6*  HCT 39.9   < > 33.9*   < > 31.7* 29.4* 32.1* 31.5* 29.8*  MCV 93.9  --  93.9  --   --  93.9  --   --   --   PLT 274  --  225  --   --  201  --   --   --    < > = values in this interval not displayed.   Cardiac Enzymes: No results for input(s): CKTOTAL, CKMB, CKMBINDEX, TROPONINI in the last 168 hours. BNP: BNP (last 3 results) No results for input(s): BNP in the last 8760 hours.  ProBNP (last 3 results) No results for input(s): PROBNP in the last 8760 hours.  CBG: No results for input(s): GLUCAP in the last 168 hours.     Signed:  Nita Sells MD   [facility] 06/18/2021, 9:31 AM

## 2021-06-18 NOTE — Progress Notes (Signed)
Pt discharged home in stable condition 

## 2021-06-18 NOTE — Progress Notes (Signed)
[facility] Progress Note  CC:  Acute GI bleed   Subjective:  No further hematochezia. No BM today. Tolerating a soft diet. No N/V. No abdominal pain. To be discharged home today.   Objective:   EGD 06/15/2021: Normal esophagus. - Normal stomach. Slight food retention but irrigated and suctioned - Normal examined duodenum. - No specimens collected. - NO CAUSE GI BLEEDING SEEN - NO BLOOD SEEN AT ALL   Flexible sigmoidoscopy 06/15/2021: - Blood in the rectum, in the sigmoid colon and in the descending colon. - No specimens collected   Vital signs in last 24 hours: Temp:  [97.9 F (36.6 C)-98.1 F (36.7 C)] 98 F (36.7 C) (06/23 0458) Pulse Rate:  [70-76] 76 (06/23 0458) Resp:  [16-17] 17 (06/23 0458) BP: (110-132)/(56-59) 110/59 (06/23 0458) SpO2:  [99 %-100 %] 100 % (06/23 0458) Last BM Date: 06/15/21  General:  Alert in NAD. Heart: RRR, no murmur.  Pulm:  Breath sounds clear throughout.  Abdomen: Soft, nondistended. Nontender. + BS x 4 quads.  Extremities:  Without edema. Neurologic:  Alert and  oriented x4;  Grossly normal neurologically. Psych:  Alert and cooperative. Normal mood and affect.  Intake/Output from previous day: 06/22 0701 - 06/23 0700 In: 720 [P.O.:720] Out: 0  Intake/Output this shift: No intake/output data recorded.  Lab Results: Recent Labs    06/15/21 1155 06/15/21 1800 06/16/21 0102 06/16/21 1059 06/17/21 0236 06/17/21 1059 06/17/21 1824 06/18/21 0249  WBC 8.0  --  10.8*  --  11.5*  --   --   --   HGB 12.9   < > 11.0*   < > 9.7* 10.5* 10.3* 9.6*  HCT 39.9   < > 33.9*   < > 29.4* 32.1* 31.5* 29.8*  PLT 274  --  225  --  201  --   --   --    < > = values in this interval not displayed.   BMET Recent Labs    06/15/21 1155 06/16/21 0102 06/17/21 0236  NA 136 136 138  K 4.6 3.8 3.9  CL 107 106 106  CO2 22 21* 26  GLUCOSE 124* 178* 115*  BUN 18 12 10   CREATININE 0.97 0.89 0.86  CALCIUM 8.7* 8.5* 8.2*    LFT Recent Labs    06/15/21 1155  PROT 6.3*  ALBUMIN 3.7  AST 28  ALT 19  ALKPHOS 68  BILITOT 1.1   PT/INR Recent Labs    06/15/21 1328  LABPROT 13.6  INR 1.0   Hepatitis Panel No results for input(s): HEPBSAG, HCVAB, HEPAIGM, HEPBIGM in the last 72 hours.  No results found.  Assessment / Plan:  12. 60 year old female with a history of diverticulosis s/p MI with 99% RCA lesion at cath with overlapping stents placed and started on Plavix and aspirin. Admitted to the hospital with acute GI bleed, passed dark red blood with clots with upper abdominal pain on 06/16/2021. Admission Hg 12.9 -> 11.4 -> 10.3 -> 9.7 -> 10.5 Today Hg  9.6. S/P EGD 6/21 was normal. Flexible sigmoidoscopy showed blood in there rectum, sigmoid and descending colon, likely diverticular bleed.  Small amount of red blood on the wash cloth yesterday after bathing. No further hematochezia. She remains hemodynamically stable.  Plavix and ASA were restarted 6/22.   -Agree with discharge home today -CBC in our office on Monday 06/22/2021, Dr. 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

## 2021-06-19 ENCOUNTER — Telehealth: Payer: Self-pay | Admitting: Gastroenterology

## 2021-06-19 NOTE — Telephone Encounter (Signed)
Inbound call from patient. Recently admitted in hospital for rectal bleeding discharged 6/23. This morning has a bowel movement dark/Light stool, there were traces of bright red blood. Best contact number [phone]

## 2021-06-19 NOTE — Telephone Encounter (Signed)
Called patient back and she states she had a little BRB on her tissue this morning. No blood in the toilet and stool is med/dark colored. She was discharged from the hospital yesterday for rectal bleeding and her Hbg.= 9.6. She denies SOB but is tired. States we are not in her network, but Dr. Carlean Purl saw her in the hospital and did a Flex Sig. She wanted to let us know she is going to urgent care and get her Hbg. drawn for her peace of mind, and she has an appt. with [facility] on Monday 06/22/21.

## 2022-07-13 IMAGING — CR DG CHEST 2V
2 series · 2 of 2 positions shown · non-contrast
Comparison: None.

CLINICAL DATA: Chest pain

EXAM:
CHEST - 2 VIEW

[chest pa]
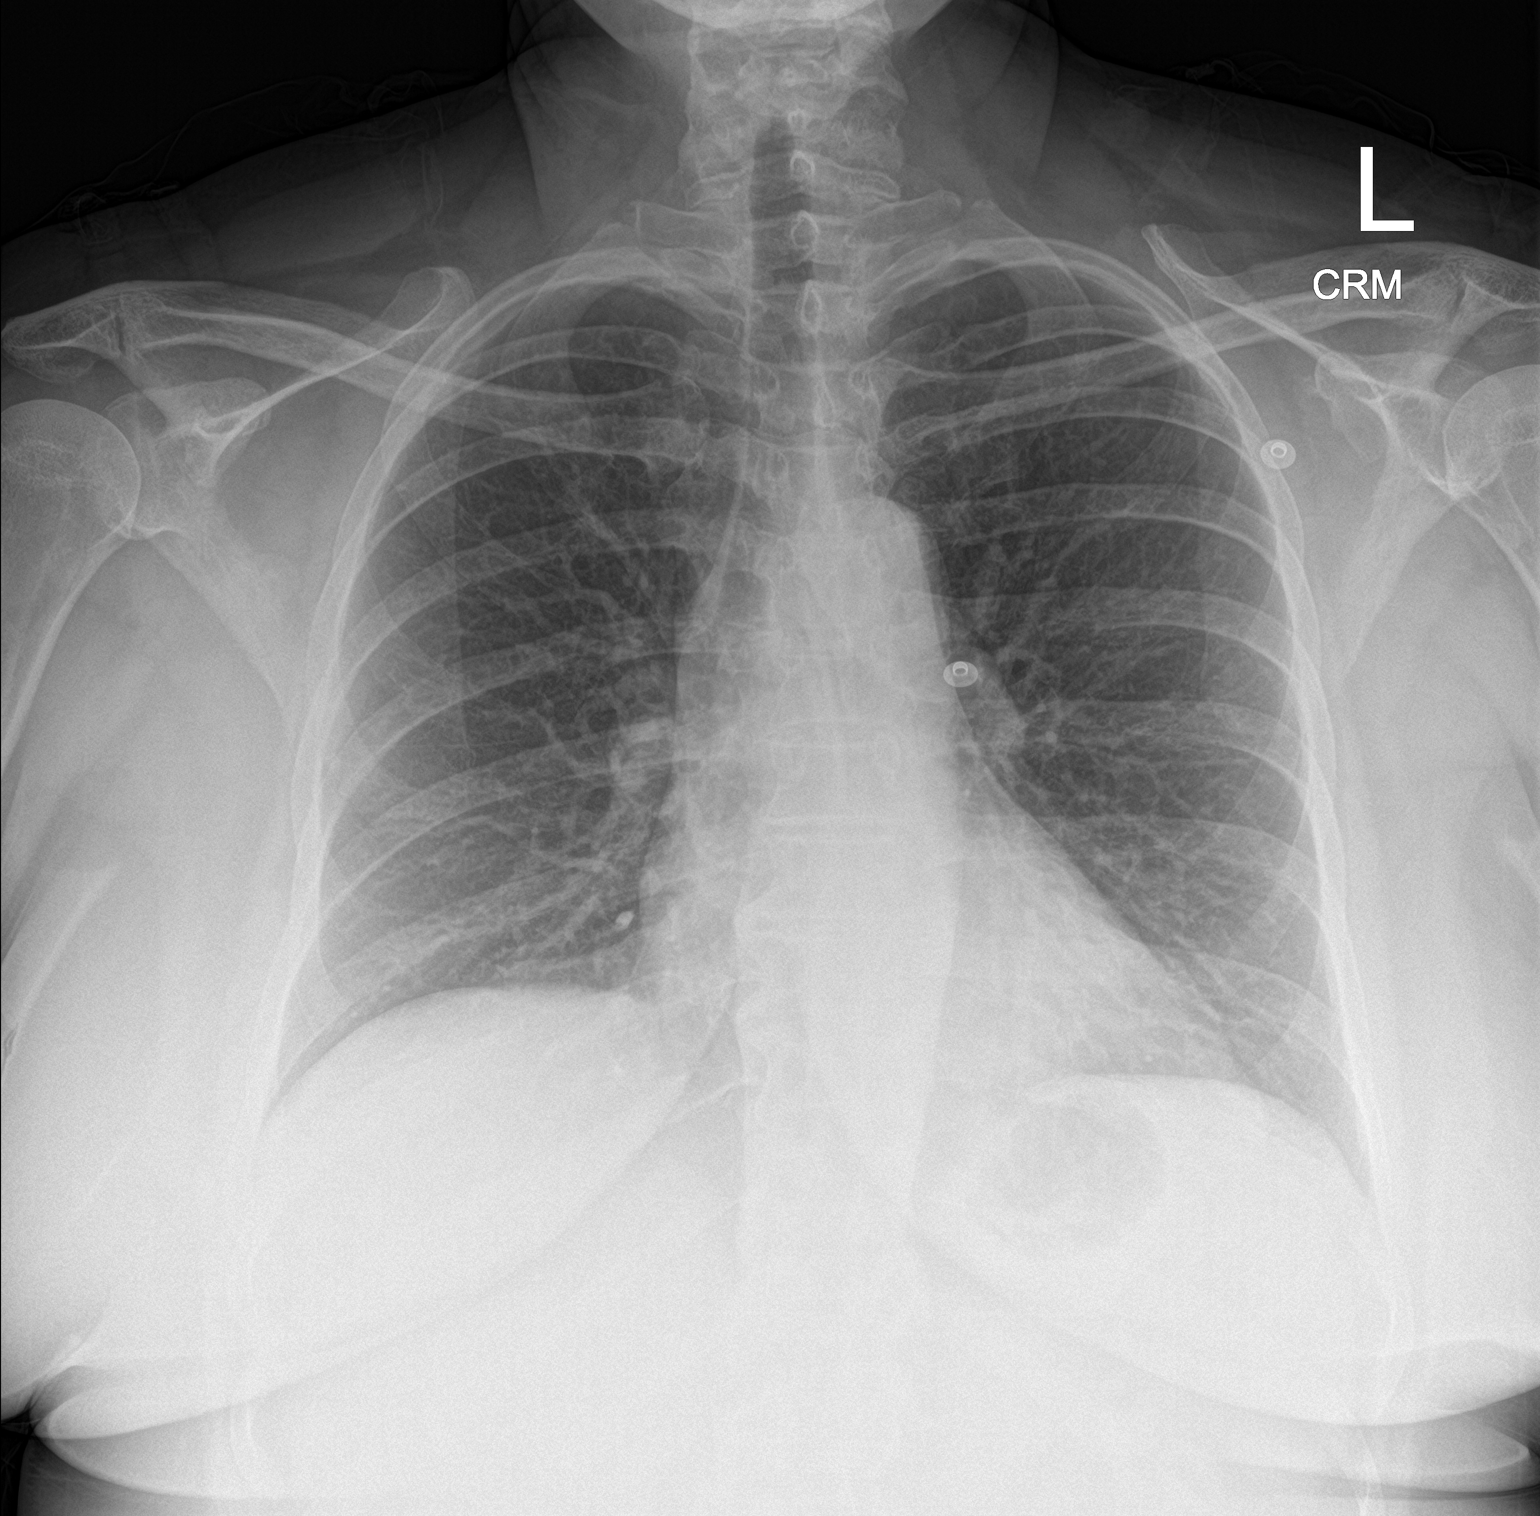

[chest lat]
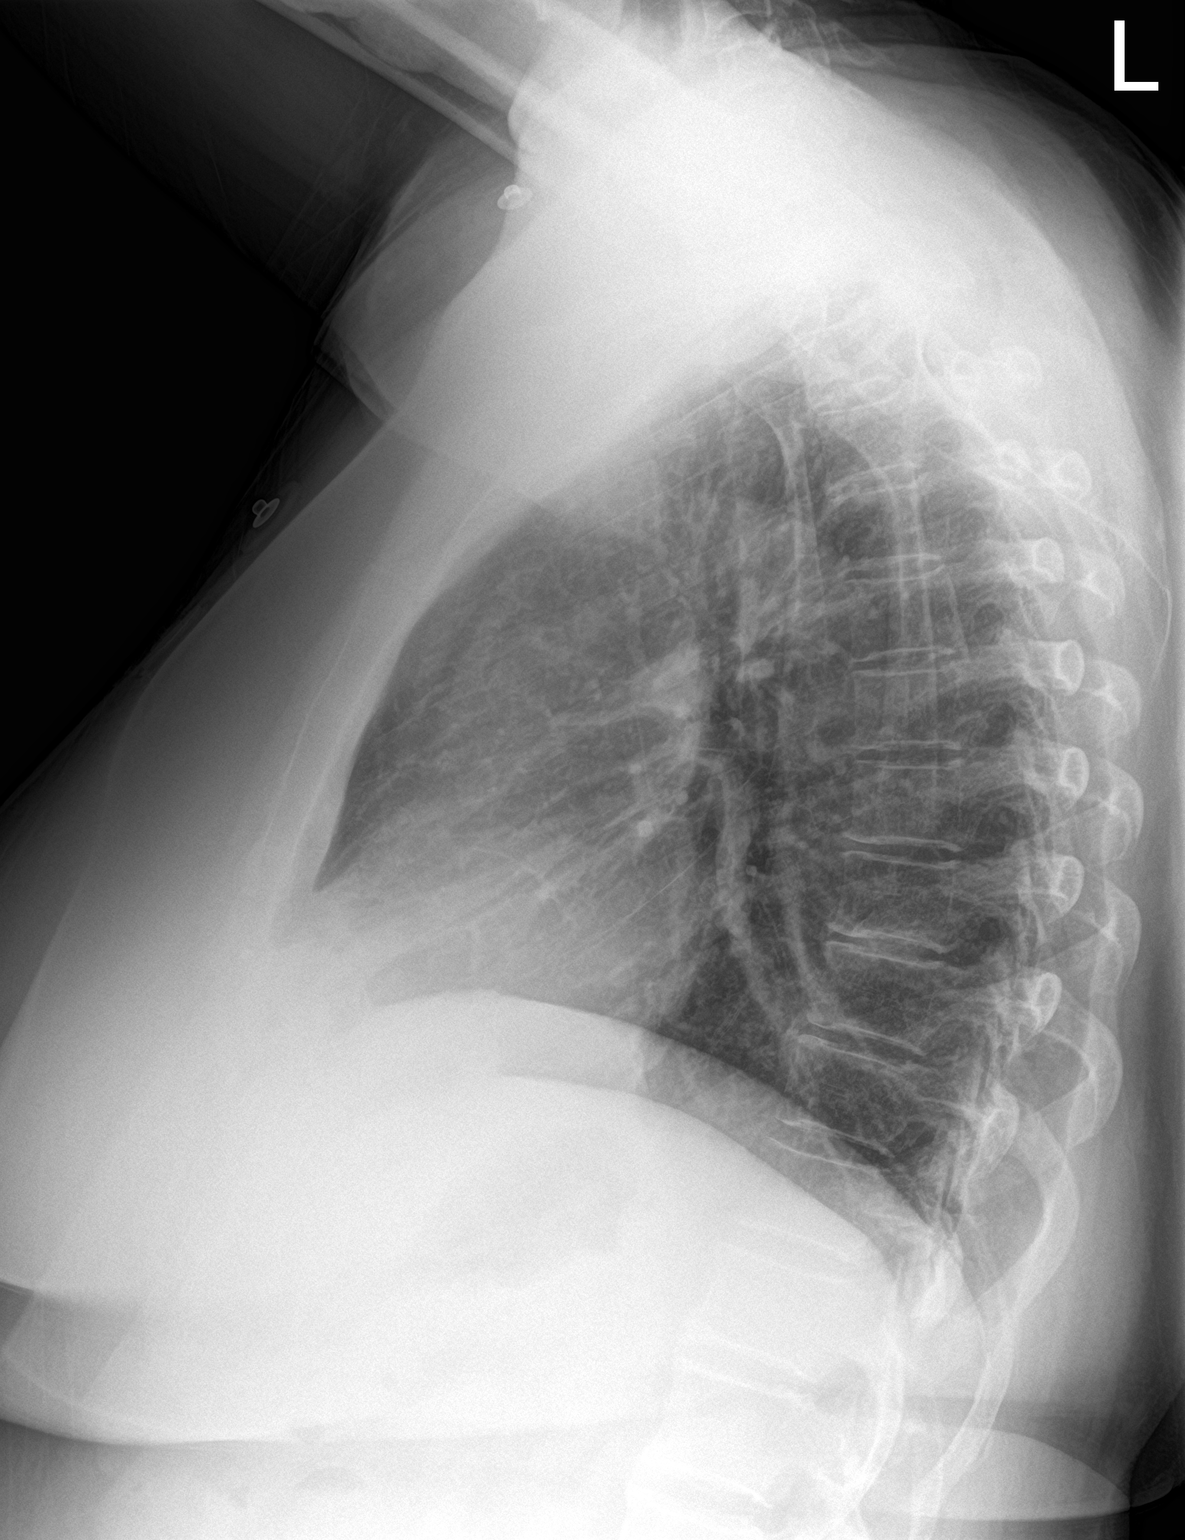

[2 of 2 positions shown; findings below may reference images not displayed]

FINDINGS: Lungs are clear. Heart size and pulmonary vascularity are normal. No
adenopathy. No pneumothorax. There is mild midthoracic
levoscoliosis.
IMPRESSION: Lungs clear.  Cardiac silhouette normal.

## 2022-11-17 ENCOUNTER — Emergency Department (HOSPITAL_COMMUNITY)
Admission: EM | Admit: 2022-11-17 | Discharge: 2022-11-17 | Discharge status: Against Medical Advice | Payer: BLUE CROSS/BLUE SHIELD | Attending: Emergency Medicine | Admitting: Emergency Medicine

## 2022-11-17 DIAGNOSIS — R531 Weakness: Secondary | ICD-10-CM | POA: Diagnosis not present

## 2022-11-17 DIAGNOSIS — R42 Dizziness and giddiness: Secondary | ICD-10-CM | POA: Insufficient documentation

## 2022-11-17 DIAGNOSIS — Z5321 Procedure and treatment not carried out due to patient leaving prior to being seen by health care provider: Secondary | ICD-10-CM | POA: Diagnosis not present

## 2022-11-17 DIAGNOSIS — R110 Nausea: Secondary | ICD-10-CM | POA: Insufficient documentation

## 2022-11-17 LAB — CBC
HCT: 41.9 % (ref 36.0–46.0)
Hemoglobin: 14 g/dL (ref 12.0–15.0)
MCH: 31.3 pg (ref 26.0–34.0)
MCHC: 33.4 g/dL (ref 30.0–36.0)
MCV: 93.7 fL (ref 80.0–100.0)
Platelets: 265 10*3/uL (ref 150–400)
RBC: 4.47 MIL/uL (ref 3.87–5.11)
RDW: 12.9 % (ref 11.5–15.5)
WBC: 6.9 10*3/uL (ref 4.0–10.5)
nRBC: 0 % (ref 0.0–0.2)

## 2022-11-17 LAB — DIFFERENTIAL
Abs Immature Granulocytes: 0.04 10*3/uL (ref 0.00–0.07)
Basophils Absolute: 0.1 10*3/uL (ref 0.0–0.1)
Basophils Relative: 1 %
Eosinophils Absolute: 0.1 10*3/uL (ref 0.0–0.5)
Eosinophils Relative: 1 %
Immature Granulocytes: 1 %
Lymphocytes Relative: 26 %
Lymphs Abs: 1.8 10*3/uL (ref 0.7–4.0)
Monocytes Absolute: 0.6 10*3/uL (ref 0.1–1.0)
Monocytes Relative: 8 %
Neutro Abs: 4.4 10*3/uL (ref 1.7–7.7)
Neutrophils Relative %: 63 %

## 2022-11-17 LAB — RAPID URINE DRUG SCREEN, HOSP PERFORMED
Amphetamines: NOT DETECTED
Barbiturates: NOT DETECTED
Benzodiazepines: NOT DETECTED
Cocaine: NOT DETECTED
Opiates: NOT DETECTED
Tetrahydrocannabinol: NOT DETECTED

## 2022-11-17 LAB — COMPREHENSIVE METABOLIC PANEL
ALT: 23 U/L (ref 0–44)
AST: 24 U/L (ref 15–41)
Albumin: 4.4 g/dL (ref 3.5–5.0)
Alkaline Phosphatase: 77 U/L (ref 38–126)
Anion gap: 12 (ref 5–15)
BUN: 10 mg/dL (ref 8–23)
CO2: 23 mmol/L (ref 22–32)
Calcium: 9.3 mg/dL (ref 8.9–10.3)
Chloride: 102 mmol/L (ref 98–111)
Creatinine, Ser: 1.09 mg/dL — ABNORMAL HIGH (ref 0.44–1.00)
GFR, Estimated: 57 mL/min — ABNORMAL LOW (ref >60)
Glucose, Bld: 106 mg/dL — ABNORMAL HIGH (ref 70–99)
Potassium: 4 mmol/L (ref 3.5–5.1)
Sodium: 137 mmol/L (ref 135–145)
Total Bilirubin: 1.1 mg/dL (ref 0.3–1.2)
Total Protein: 7.4 g/dL (ref 6.5–8.1)

## 2022-11-17 LAB — URINALYSIS, ROUTINE W REFLEX MICROSCOPIC
Bilirubin Urine: NEGATIVE
Glucose, UA: NEGATIVE mg/dL
Hgb urine dipstick: NEGATIVE
Ketones, ur: NEGATIVE mg/dL
Leukocytes,Ua: NEGATIVE
Nitrite: NEGATIVE
Protein, ur: NEGATIVE mg/dL
Specific Gravity, Urine: 1.008 (ref 1.005–1.030)
pH: 5 (ref 5.0–8.0)

## 2022-11-17 LAB — PROTIME-INR
INR: 0.9 (ref 0.8–1.2)
Prothrombin Time: 12.3 seconds (ref 11.4–15.2)

## 2022-11-17 LAB — ETHANOL: Alcohol, Ethyl (B): <10 mg/dL (ref <10)

## 2022-11-17 LAB — APTT: aPTT: 26 seconds (ref 24–36)

## 2022-11-17 MED ORDER — SODIUM CHLORIDE 0.9 % IV BOLUS: 500.0000 mL | Freq: Once | INTRAVENOUS | Status: DC

## 2022-11-17 MED ORDER — SODIUM CHLORIDE 0.9 % IV SOLN: 100.0000 mL/h | INTRAVENOUS | Status: DC

## 2022-11-17 NOTE — ED Provider Triage Note (Signed)
Emergency Medicine Provider Triage Evaluation Note  Heather Pace , a 61 y.o. female  was evaluated in triage.  Pt complains of dizziness, nausea, anorexia and 1 episode of chest pain.  Symptoms are new today.  She does note that recently she has been hypotensive in the morning, in the past few days has been normotensive.  No recent medication changes beyond a decrease in her lisinopril from 20 mg to 10 mg and cessation of metformin.  Currently no focal pain, no confusion.  Review of Systems  Positive: Per HPI Negative: Syncope  Physical Exam  BP (!) 185/90   Pulse 95   Temp 98.2 F (36.8 C)   Resp 18   SpO2 98%  Gen:   Awake, no distress speaking clearly Resp:  Normal effort no increased work of breathing MSK:   Moves extremities without difficulty no deformity Other:  Neuro: Speech clear face is symmetric, and with slight head rotation laterally mild discomfort but with no nausea, vomiting or cessation of activity  Medical Decision Making  Medically screening exam initiated at 1:29 PM.  Appropriate orders placed.  Heather Pace was informed that the remainder of the evaluation will be completed by another provider, this initial triage assessment does not replace that evaluation, and the importance of remaining in the ED until their evaluation is complete.   Carmin Muskrat, MD 11/17/22 1330

## 2022-11-17 NOTE — ED Triage Notes (Signed)
Patient here with complaint of dizziness, nausea, and generalized weakness. No focal deficits, denies vomiting. Patient alert, oriented, and ambulating independently with steady gait

## 2022-11-17 NOTE — ED Notes (Signed)
Patient stated she has my chart and is leaving
